# Patient Record
Sex: Female | Born: 1950 | Race: White | Hispanic: No | Marital: Married | Smoking: Never smoker
Health system: Southern US, Community
[De-identification: ages and names within clinical notes are randomized; demographics above are authoritative.]

## PROBLEM LIST (undated history)

## (undated) DIAGNOSIS — C4490 Unspecified malignant neoplasm of skin, unspecified: Secondary | ICD-10-CM

## (undated) DIAGNOSIS — E039 Hypothyroidism, unspecified: Secondary | ICD-10-CM

## (undated) DIAGNOSIS — E079 Disorder of thyroid, unspecified: Secondary | ICD-10-CM

## (undated) DIAGNOSIS — I10 Essential (primary) hypertension: Secondary | ICD-10-CM

## (undated) HISTORY — PX: COLON SURGERY: SHX602

## (undated) HISTORY — DX: Unspecified malignant neoplasm of skin, unspecified: C44.90

## (undated) HISTORY — PX: EYE SURGERY: SHX253

## (undated) HISTORY — PX: COLONOSCOPY: SHX174

---

## 2007-02-01 ENCOUNTER — Ambulatory Visit: Payer: Self-pay | Admitting: Gastroenterology

## 2010-03-31 ENCOUNTER — Ambulatory Visit: Payer: Self-pay

## 2010-09-06 DEATH — deceased

## 2011-06-15 ENCOUNTER — Ambulatory Visit: Payer: Self-pay

## 2012-07-05 ENCOUNTER — Ambulatory Visit: Payer: Self-pay

## 2013-10-06 ENCOUNTER — Ambulatory Visit: Payer: Self-pay

## 2015-03-08 ENCOUNTER — Other Ambulatory Visit: Payer: Self-pay | Admitting: Physician Assistant

## 2015-03-08 DIAGNOSIS — Z1231 Encounter for screening mammogram for malignant neoplasm of breast: Secondary | ICD-10-CM

## 2015-03-13 ENCOUNTER — Ambulatory Visit
Admission: RE | Admit: 2015-03-13 | Discharge: 2015-03-13 | Disposition: A | Discharge status: Home / Self Care | Payer: BLUE CROSS/BLUE SHIELD | Source: Ambulatory Visit | Attending: Physician Assistant | Admitting: Physician Assistant

## 2015-03-13 DIAGNOSIS — Z1231 Encounter for screening mammogram for malignant neoplasm of breast: Secondary | ICD-10-CM

## 2015-03-24 ENCOUNTER — Encounter: Payer: Self-pay | Admitting: *Deleted

## 2015-03-24 ENCOUNTER — Emergency Department
Admission: EM | Admit: 2015-03-24 | Discharge: 2015-03-24 | Disposition: A | Discharge status: Home / Self Care | Payer: BLUE CROSS/BLUE SHIELD | Attending: Emergency Medicine | Admitting: Emergency Medicine

## 2015-03-24 DIAGNOSIS — R519 Headache, unspecified: Secondary | ICD-10-CM

## 2015-03-24 DIAGNOSIS — I10 Essential (primary) hypertension: Secondary | ICD-10-CM | POA: Diagnosis not present

## 2015-03-24 DIAGNOSIS — E079 Disorder of thyroid, unspecified: Secondary | ICD-10-CM | POA: Diagnosis not present

## 2015-03-24 DIAGNOSIS — R51 Headache: Secondary | ICD-10-CM | POA: Insufficient documentation

## 2015-03-24 HISTORY — DX: Essential (primary) hypertension: I10

## 2015-03-24 HISTORY — DX: Disorder of thyroid, unspecified: E07.9

## 2015-03-24 NOTE — ED Provider Notes (Signed)
[facility] Emergency Department Provider Note  ____________________________________________  Time seen: Approximately 9:30 PM  I have reviewed the triage vital signs and the nursing notes.   HISTORY  Chief Complaint Hypertension and Headache    HPI Alexandria Craig is a 65 y.o. female with a history of hypertension and recent diagnosis of the flu is presenting tonight with a headache and hypertension. She says that the headache started about 6 PM tonight and was a 5 out of 10. It was frontal. Pressure-like and is now completely resolved. She became concerned when she took her blood pressure and it was up in the high 100s over low 100s. She says that she has been compliant with her blood pressure medications. Has been drinking plenty of water as to not Dehydrated with her flu symptoms. She denies any shortness of breath or chest pain. Denies any cardiac disease.   Past Medical History  Diagnosis Date  . Hypertension   . Thyroid disease     There are no active problems to display for this patient.   Past Surgical History  Procedure Laterality Date  . Cesarean section      Current Outpatient Rx  Name  Route  Sig  Dispense  Refill  . acetaminophen (TYLENOL) 325 MG tablet   Oral   Take 650 mg by mouth every 6 (six) hours as needed.         Marland Kitchen aspirin EC 81 MG tablet   Oral   Take 81 mg by mouth daily.         . benazepril (LOTENSIN) 20 MG tablet   Oral   Take 20 mg by mouth daily.         Marland Kitchen Fexofenadine HCl (MUCINEX ALLERGY PO)   Oral   Take 1 tablet by mouth daily as needed.         Marland Kitchen guaiFENesin-codeine (ROBITUSSIN AC) 100-10 MG/5ML syrup   Oral   Take 10 mLs by mouth every 4 (four) hours as needed for cough.         . levothyroxine (SYNTHROID, LEVOTHROID) 75 MCG tablet   Oral   Take 75 mcg by mouth daily before breakfast.           Allergies Review of patient's allergies indicates no known allergies.  Family History   Problem Relation Age of Onset  . Breast cancer Mother 51    Social History Social History  Substance Use Topics  . Smoking status: Never Smoker   . Smokeless tobacco: Never Used  . Alcohol Use: No    Review of Systems Constitutional: No fever/chills Eyes: No visual changes. ENT: No sore throat. Cardiovascular: Denies chest pain. Respiratory: Cough which she says is improving with her flu treatment. Gastrointestinal: No abdominal pain.  No nausea, no vomiting.  No diarrhea.  No constipation. Genitourinary: Negative for dysuria. Musculoskeletal: Negative for back pain. Skin: Negative for rash. Neurological: Negative for  focal weakness or numbness.  10-point ROS otherwise negative.  ____________________________________________   PHYSICAL EXAM:  VITAL SIGNS: ED Triage Vitals  Enc Vitals Group     BP 03/24/15 2009 180/83 mmHg     Pulse Rate 03/24/15 2009 103     Resp 03/24/15 2009 22     Temp 03/24/15 2009 98.3 F (36.8 C)     Temp Source 03/24/15 2009 Oral     SpO2 03/24/15 2009 97 %     Weight 03/24/15 2009 147 lb (66.679 kg)     Height 03/24/15  2009 5\' 2"  (1.575 m)     Head Cir --      Peak Flow --      Pain Score 03/24/15 2010 0     Pain Loc --      Pain Edu? --      Excl. in GC? --     Constitutional: Alert and oriented. Well appearing and in no acute distress. Eyes: Conjunctivae are normal. PERRL. EOMI. Head: Atraumatic. Nose: No congestion/rhinnorhea. Mouth/Throat: Mucous membranes are moist.  Oropharynx non-erythematous. Neck: No stridor.   Cardiovascular: Normal rate, regular rhythm. Grossly normal heart sounds.  Good peripheral circulation. Asked to be slowly and her heart rate goes down to 96 bpm. Respiratory rate of 16 when she is relaxed. She does go into several coughing fits at which point her heart rate increased to the 120s but with slow breathing decreased back down to the 90s. Respiratory: Normal respiratory effort.  No retractions. Lungs  CTAB. Expiratory cough. Gastrointestinal: Soft and nontender. No distention. No CVA tenderness. Musculoskeletal: No lower extremity tenderness nor edema.  No joint effusions. Neurologic:  Normal speech and language. No gross focal neurologic deficits are appreciated. No gait instability. Skin:  Skin is warm, dry and intact. No rash noted. Psychiatric: Mood and affect are normal. Speech and behavior are normal.  ____________________________________________   LABS (all labs ordered are listed, but only abnormal results are displayed)  Labs Reviewed - No data to display ____________________________________________  EKG  ED ECG REPORT I, Porchia Sinkler,  Teena Iraniavid M, the attending physician, personally viewed and interpreted this ECG.   Date: 03/24/2015  EKG Time: 2025  Rate: 100  Rhythm: sinus tachycardia  Axis: Normal  Intervals:none  ST&T Change: No ST segment elevation or depression. No abnormal T-wave inversion.  ____________________________________________  RADIOLOGY   ____________________________________________   PROCEDURES   ____________________________________________   INITIAL IMPRESSION / ASSESSMENT AND PLAN / ED COURSE  Pertinent labs & imaging results that were available during my care of the patient were reviewed by me and considered in my medical decision making (see chart for details).  Patient without sudden onset, thunderclap worst headache of her life. No neck pain. No focal weakness. No chest pain or shortness of breath. Headache is resolved at this point spontaneously. Blood pressure is elevated but this is in the context of her flu and multiple coughing fits. It has decreased down to the 140s and 150s systolic in the emergency department. However, when I'm in the room and she has had several coughing fits and increases back up to the 170s and 180s. I do not see any clinical signs or symptoms at this time that the patient may be having an intracranial  hemorrhage. Possible headache from her flulike illness I feel is more likely than from the elevated blood pressure. Will be discharged home. I recommended that she continue with her blood pressure medication and make sure to drink plenty of fluids. She will also be following up with her primary care doctor later in the week in order to have a blood pressure recheck. The patient as well as her husband, who is at the bedside, understand the plan and are willing to comply. ____________________________________________   FINAL CLINICAL IMPRESSION(S) / ED DIAGNOSES  Hypertension. Headache.    Myrna Blazeravid Matthew Orvis Stann, MD 03/24/15 2158

## 2015-03-24 NOTE — ED Notes (Signed)
Pt states she started having a headache tonight. Pt states she checked her BP and it was elevated. Pt recently dx'd w/ flu x 5 days ago. Pt in no acute distress at this time. Pt states she now has a mild headache. Pt is tachycardiac and hypertensive during triage. Pt has a moist, non-productive cough. Pt denies chest pain and shortness of breath and was ambulatory from waiting room to back w/o distress.

## 2015-04-01 ENCOUNTER — Ambulatory Visit
Admission: RE | Admit: 2015-04-01 | Discharge: 2015-04-01 | Disposition: A | Discharge status: Home / Self Care | Payer: BLUE CROSS/BLUE SHIELD | Source: Ambulatory Visit | Attending: Physician Assistant | Admitting: Physician Assistant

## 2015-04-01 ENCOUNTER — Other Ambulatory Visit: Payer: Self-pay | Admitting: Physician Assistant

## 2015-04-01 DIAGNOSIS — R05 Cough: Secondary | ICD-10-CM | POA: Insufficient documentation

## 2015-04-01 DIAGNOSIS — R0602 Shortness of breath: Secondary | ICD-10-CM | POA: Insufficient documentation

## 2015-04-01 DIAGNOSIS — J189 Pneumonia, unspecified organism: Secondary | ICD-10-CM

## 2016-01-31 ENCOUNTER — Other Ambulatory Visit: Payer: Self-pay | Admitting: Physician Assistant

## 2016-02-06 ENCOUNTER — Other Ambulatory Visit: Payer: Self-pay | Admitting: Physician Assistant

## 2016-02-06 DIAGNOSIS — Z1231 Encounter for screening mammogram for malignant neoplasm of breast: Secondary | ICD-10-CM

## 2016-03-17 ENCOUNTER — Ambulatory Visit: Payer: BLUE CROSS/BLUE SHIELD

## 2016-03-19 ENCOUNTER — Ambulatory Visit
Admission: RE | Admit: 2016-03-19 | Discharge: 2016-03-19 | Disposition: A | Discharge status: Home / Self Care | Payer: BLUE CROSS/BLUE SHIELD | Source: Ambulatory Visit | Attending: Physician Assistant | Admitting: Physician Assistant

## 2016-03-19 DIAGNOSIS — Z1231 Encounter for screening mammogram for malignant neoplasm of breast: Secondary | ICD-10-CM | POA: Diagnosis not present

## 2017-04-05 ENCOUNTER — Other Ambulatory Visit: Payer: Self-pay | Admitting: Physician Assistant

## 2017-04-05 DIAGNOSIS — Z1231 Encounter for screening mammogram for malignant neoplasm of breast: Secondary | ICD-10-CM

## 2017-04-22 ENCOUNTER — Ambulatory Visit
Admission: RE | Admit: 2017-04-22 | Discharge: 2017-04-22 | Disposition: A | Discharge status: Home / Self Care | Payer: Medicare Other | Source: Ambulatory Visit | Attending: Physician Assistant | Admitting: Physician Assistant

## 2017-04-22 DIAGNOSIS — R928 Other abnormal and inconclusive findings on diagnostic imaging of breast: Secondary | ICD-10-CM | POA: Diagnosis not present

## 2017-04-22 DIAGNOSIS — Z1231 Encounter for screening mammogram for malignant neoplasm of breast: Secondary | ICD-10-CM | POA: Diagnosis not present

## 2017-04-22 DIAGNOSIS — N6310 Unspecified lump in the right breast, unspecified quadrant: Secondary | ICD-10-CM | POA: Insufficient documentation

## 2017-04-27 ENCOUNTER — Other Ambulatory Visit: Payer: Self-pay | Admitting: Physician Assistant

## 2017-04-27 DIAGNOSIS — N6310 Unspecified lump in the right breast, unspecified quadrant: Secondary | ICD-10-CM

## 2017-04-27 DIAGNOSIS — R928 Other abnormal and inconclusive findings on diagnostic imaging of breast: Secondary | ICD-10-CM

## 2017-05-13 ENCOUNTER — Ambulatory Visit
Admission: RE | Admit: 2017-05-13 | Discharge: 2017-05-13 | Disposition: A | Discharge status: Home / Self Care | Payer: Medicare Other | Source: Ambulatory Visit | Attending: Physician Assistant | Admitting: Physician Assistant

## 2017-05-13 DIAGNOSIS — N6310 Unspecified lump in the right breast, unspecified quadrant: Secondary | ICD-10-CM

## 2017-05-13 DIAGNOSIS — R928 Other abnormal and inconclusive findings on diagnostic imaging of breast: Secondary | ICD-10-CM

## 2017-12-27 ENCOUNTER — Other Ambulatory Visit: Payer: Self-pay | Admitting: Physician Assistant

## 2017-12-27 DIAGNOSIS — Z13820 Encounter for screening for osteoporosis: Secondary | ICD-10-CM

## 2018-02-09 ENCOUNTER — Ambulatory Visit
Admission: RE | Admit: 2018-02-09 | Discharge: 2018-02-09 | Disposition: A | Discharge status: Home / Self Care | Payer: Medicare Other | Source: Ambulatory Visit | Attending: Physician Assistant | Admitting: Physician Assistant

## 2018-02-09 DIAGNOSIS — M8589 Other specified disorders of bone density and structure, multiple sites: Secondary | ICD-10-CM | POA: Diagnosis not present

## 2018-02-09 DIAGNOSIS — Z13820 Encounter for screening for osteoporosis: Secondary | ICD-10-CM | POA: Insufficient documentation

## 2018-11-23 ENCOUNTER — Other Ambulatory Visit: Payer: Self-pay | Admitting: Physician Assistant

## 2018-11-23 DIAGNOSIS — Z1231 Encounter for screening mammogram for malignant neoplasm of breast: Secondary | ICD-10-CM

## 2019-06-08 ENCOUNTER — Encounter: Payer: Self-pay | Admitting: Ophthalmology

## 2019-06-08 ENCOUNTER — Other Ambulatory Visit: Payer: Self-pay

## 2019-06-12 ENCOUNTER — Other Ambulatory Visit
Admission: RE | Admit: 2019-06-12 | Discharge: 2019-06-12 | Disposition: A | Discharge status: Home / Self Care | Payer: PRIVATE HEALTH INSURANCE | Source: Ambulatory Visit | Attending: Ophthalmology | Admitting: Ophthalmology

## 2019-06-12 ENCOUNTER — Other Ambulatory Visit: Payer: Self-pay

## 2019-06-12 DIAGNOSIS — Z01812 Encounter for preprocedural laboratory examination: Secondary | ICD-10-CM | POA: Insufficient documentation

## 2019-06-12 DIAGNOSIS — Z20822 Contact with and (suspected) exposure to covid-19: Secondary | ICD-10-CM | POA: Diagnosis not present

## 2019-06-12 LAB — SARS CORONAVIRUS 2 (TAT 6-24 HRS): SARS Coronavirus 2: NEGATIVE

## 2019-06-12 NOTE — Discharge Instructions (Signed)

## 2019-06-14 ENCOUNTER — Ambulatory Visit
Admission: RE | Admit: 2019-06-14 | Discharge: 2019-06-14 | Disposition: A | Discharge status: Home / Self Care | Payer: Medicare Other | Attending: Ophthalmology | Admitting: Ophthalmology

## 2019-06-14 ENCOUNTER — Ambulatory Visit: Payer: Medicare Other | Admitting: Anesthesiology

## 2019-06-14 ENCOUNTER — Encounter: Payer: Self-pay | Admitting: Ophthalmology

## 2019-06-14 ENCOUNTER — Encounter
Admission: RE | Disposition: A | Discharge status: Home / Self Care | Payer: Self-pay | Source: Home / Self Care | Attending: Ophthalmology

## 2019-06-14 ENCOUNTER — Other Ambulatory Visit: Payer: Self-pay

## 2019-06-14 DIAGNOSIS — Z79890 Hormone replacement therapy (postmenopausal): Secondary | ICD-10-CM | POA: Insufficient documentation

## 2019-06-14 DIAGNOSIS — Z7982 Long term (current) use of aspirin: Secondary | ICD-10-CM | POA: Diagnosis not present

## 2019-06-14 DIAGNOSIS — Z79899 Other long term (current) drug therapy: Secondary | ICD-10-CM | POA: Diagnosis not present

## 2019-06-14 DIAGNOSIS — E039 Hypothyroidism, unspecified: Secondary | ICD-10-CM | POA: Diagnosis not present

## 2019-06-14 DIAGNOSIS — H2511 Age-related nuclear cataract, right eye: Secondary | ICD-10-CM | POA: Insufficient documentation

## 2019-06-14 DIAGNOSIS — I10 Essential (primary) hypertension: Secondary | ICD-10-CM | POA: Diagnosis not present

## 2019-06-14 HISTORY — DX: Hypothyroidism, unspecified: E03.9

## 2019-06-14 HISTORY — PX: CATARACT EXTRACTION W/PHACO: SHX586

## 2019-06-14 SURGERY — PHACOEMULSIFICATION, CATARACT, WITH IOL INSERTION
Anesthesia: Monitor Anesthesia Care | Site: Eye | Laterality: Right

## 2019-06-14 MED ORDER — FENTANYL CITRATE (PF) 100 MCG/2ML IJ SOLN
INTRAMUSCULAR | Status: DC | PRN
  Administered 2019-06-14 (×2): 50 ug via INTRAVENOUS

## 2019-06-14 MED ORDER — MIDAZOLAM HCL 2 MG/2ML IJ SOLN
INTRAMUSCULAR | Status: DC | PRN
  Administered 2019-06-14: 2 mg via INTRAVENOUS

## 2019-06-14 MED ORDER — LIDOCAINE HCL (PF) 2 % IJ SOLN
INTRAOCULAR | Status: DC | PRN
  Administered 2019-06-14: 1 mL

## 2019-06-14 MED ORDER — MOXIFLOXACIN HCL 0.5 % OP SOLN
1.0000 [drp] | OPHTHALMIC | Status: DC | PRN
  Administered 2019-06-14 (×3): 1 [drp] via OPHTHALMIC

## 2019-06-14 MED ORDER — CEFUROXIME OPHTHALMIC INJECTION 1 MG/0.1 ML
INJECTION | OPHTHALMIC | Status: DC | PRN
  Administered 2019-06-14: 0.1 mL via INTRACAMERAL

## 2019-06-14 MED ORDER — ARMC OPHTHALMIC DILATING DROPS
1.0000 "application " | OPHTHALMIC | Status: DC | PRN
  Administered 2019-06-14 (×3): 1 via OPHTHALMIC

## 2019-06-14 MED ORDER — EPINEPHRINE PF 1 MG/ML IJ SOLN
INTRAOCULAR | Status: DC | PRN
  Administered 2019-06-14: 55 mL via OPHTHALMIC

## 2019-06-14 MED ORDER — NA HYALUR & NA CHOND-NA HYALUR 0.4-0.35 ML IO KIT
PACK | INTRAOCULAR | Status: DC | PRN
  Administered 2019-06-14: 1 mL via INTRAOCULAR

## 2019-06-14 MED ORDER — TETRACAINE HCL 0.5 % OP SOLN
1.0000 [drp] | OPHTHALMIC | Status: DC | PRN
  Administered 2019-06-14 (×3): 1 [drp] via OPHTHALMIC

## 2019-06-14 MED ORDER — BRIMONIDINE TARTRATE-TIMOLOL 0.2-0.5 % OP SOLN
OPHTHALMIC | Status: DC | PRN
  Administered 2019-06-14: 1 [drp] via OPHTHALMIC

## 2019-06-14 SURGICAL SUPPLY — 29 items
CANNULA ANT/CHMB 27G (MISCELLANEOUS) ×1 IMPLANT
CANNULA ANT/CHMB 27GA (MISCELLANEOUS) ×3 IMPLANT
GLOVE SURG LX 7.5 STRW (GLOVE) ×4
GLOVE SURG LX STRL 7.5 STRW (GLOVE) ×1 IMPLANT
GLOVE SURG TRIUMPH 8.0 PF LTX (GLOVE) ×3 IMPLANT
GOWN STRL REUS W/ TWL LRG LVL3 (GOWN DISPOSABLE) ×2 IMPLANT
GOWN STRL REUS W/TWL LRG LVL3 (GOWN DISPOSABLE) ×4
LENS IOL DIOP 23.0 (Intraocular Lens) ×3 IMPLANT
LENS IOL TECNIS MONO 23.0 (Intraocular Lens) IMPLANT
MARKER SKIN DUAL TIP RULER LAB (MISCELLANEOUS) ×3 IMPLANT
NDL CAPSULORHEX 25GA (NEEDLE) ×1 IMPLANT
NDL FILTER BLUNT 18X1 1/2 (NEEDLE) ×2 IMPLANT
NDL RETROBULBAR .5 NSTRL (NEEDLE) IMPLANT
NEEDLE CAPSULORHEX 25GA (NEEDLE) ×3 IMPLANT
NEEDLE FILTER BLUNT 18X 1/2SAF (NEEDLE) ×4
NEEDLE FILTER BLUNT 18X1 1/2 (NEEDLE) ×2 IMPLANT
PACK CATARACT BRASINGTON (MISCELLANEOUS) ×3 IMPLANT
PACK EYE AFTER SURG (MISCELLANEOUS) ×3 IMPLANT
PACK OPTHALMIC (MISCELLANEOUS) ×3 IMPLANT
RING MALYGIN 7.0 (MISCELLANEOUS) IMPLANT
SOLUTION OPHTHALMIC SALT (MISCELLANEOUS) ×3 IMPLANT
SUT ETHILON 10-0 CS-B-6CS-B-6 (SUTURE)
SUT VICRYL  9 0 (SUTURE)
SUT VICRYL 9 0 (SUTURE) IMPLANT
SUTURE EHLN 10-0 CS-B-6CS-B-6 (SUTURE) IMPLANT
SYR 3ML LL SCALE MARK (SYRINGE) ×6 IMPLANT
SYR TB 1ML LUER SLIP (SYRINGE) ×3 IMPLANT
WATER STERILE IRR 250ML POUR (IV SOLUTION) ×3 IMPLANT
WIPE NON LINTING 3.25X3.25 (MISCELLANEOUS) ×3 IMPLANT

## 2019-06-14 NOTE — Op Note (Signed)
LOCATION:  [facility]   PREOPERATIVE DIAGNOSIS:    Nuclear sclerotic cataract right eye. H25.11   POSTOPERATIVE DIAGNOSIS:  Nuclear sclerotic cataract right eye.     PROCEDURE:  Phacoemusification with posterior chamber intraocular lens placement of the right eye   ULTRASOUND TIME: Procedure(s) with comments: CATARACT EXTRACTION PHACO AND INTRAOCULAR LENS PLACEMENT (IOC) RIGHT (Right) - 8.13, 0:50, 16.1%  LENS:   Implant Name Type Inv. Item Serial No. Manufacturer Lot No. LRB No. Used Action  LENS IOL DIOP 23.0 - [id number] Intraocular Lens LENS IOL DIOP 23.0 [id number] AMO  Right 1 Implanted         SURGEON:  Deirdre Evener, MD   ANESTHESIA:  Topical with tetracaine drops and 2% Xylocaine jelly, augmented with 1% preservative-free intracameral lidocaine.    COMPLICATIONS:  None.   DESCRIPTION OF PROCEDURE:  The patient was identified in the holding room and transported to the operating room and placed in the supine position under the operating microscope.  The right eye was identified as the operative eye and it was prepped and draped in the usual sterile ophthalmic fashion.   A 1 millimeter clear-corneal paracentesis was made at the 12:00 position.  0.5 ml of preservative-free 1% lidocaine was injected into the anterior chamber. The anterior chamber was filled with Viscoat viscoelastic.  A 2.4 millimeter keratome was used to make a near-clear corneal incision at the 9:00 position.  A curvilinear capsulorrhexis was made with a cystotome and capsulorrhexis forceps.  Balanced salt solution was used to hydrodissect and hydrodelineate the nucleus.   Phacoemulsification was then used in stop and chop fashion to remove the lens nucleus and epinucleus.  The remaining cortex was then removed using the irrigation and aspiration handpiece. Provisc was then placed into the capsular bag to distend it for lens placement.  A lens was then injected into the capsular bag.  The  remaining viscoelastic was aspirated.   Wounds were hydrated with balanced salt solution.  The anterior chamber was inflated to a physiologic pressure with balanced salt solution.  No wound leaks were noted. Cefuroxime 0.1 ml of a 10mg /ml solution was injected into the anterior chamber for a dose of 1 mg of intracameral antibiotic at the completion of the case.   Timolol and Brimonidine drops were applied to the eye.  The patient was taken to the recovery room in stable condition without complications of anesthesia or surgery.   Geralynn Capri 06/14/2019, 9:38 AM

## 2019-06-14 NOTE — Anesthesia Procedure Notes (Signed)
Procedure Name: MAC Date/Time: 06/14/2019 9:20 AM Performed by: Jeannene Patella, CRNA Pre-anesthesia Checklist: Patient identified, Emergency Drugs available, Suction available, Patient being monitored and Timeout performed Patient Re-evaluated:Patient Re-evaluated prior to induction Oxygen Delivery Method: Nasal cannula

## 2019-06-14 NOTE — H&P (Signed)

## 2019-06-14 NOTE — Transfer of Care (Signed)
Immediate Anesthesia Transfer of Care Note  Patient: Alexandria Craig  Procedure(s) Performed: CATARACT EXTRACTION PHACO AND INTRAOCULAR LENS PLACEMENT (IOC) RIGHT (Right Eye)  Patient Location: PACU  Anesthesia Type: MAC  Level of Consciousness: awake, alert  and patient cooperative  Airway and Oxygen Therapy: Patient Spontanous Breathing and Patient connected to supplemental oxygen  Post-op Assessment: Post-op Vital signs reviewed, Patient's Cardiovascular Status Stable, Respiratory Function Stable, Patent Airway and No signs of Nausea or vomiting  Post-op Vital Signs: Reviewed and stable  Complications: No apparent anesthesia complications

## 2019-06-14 NOTE — Anesthesia Preprocedure Evaluation (Signed)
Anesthesia Evaluation  Patient identified by MRN, date of birth, ID band Patient awake    History of Anesthesia Complications Negative for: history of anesthetic complications  Airway Mallampati: II  TM Distance: >3 FB Neck ROM: Full    Dental no notable dental hx.    Pulmonary neg pulmonary ROS,    Pulmonary exam normal        Cardiovascular Exercise Tolerance: Good hypertension, Pt. on medications Normal cardiovascular exam     Neuro/Psych negative neurological ROS     GI/Hepatic negative GI ROS, Neg liver ROS,   Endo/Other  Hypothyroidism   Renal/GU negative Renal ROS     Musculoskeletal negative musculoskeletal ROS (+)   Abdominal   Peds  Hematology negative hematology ROS (+)   Anesthesia Other Findings   Reproductive/Obstetrics                            Anesthesia Physical Anesthesia Plan  ASA: II  Anesthesia Plan: MAC   Post-op Pain Management:    Induction: Intravenous  PONV Risk Score and Plan: 2 and Midazolam, TIVA and Treatment may vary due to age or medical condition  Airway Management Planned: Nasal Cannula and Natural Airway  Additional Equipment: None  Intra-op Plan:   Post-operative Plan:   Informed Consent: I have reviewed the patients History and Physical, chart, labs and discussed the procedure including the risks, benefits and alternatives for the proposed anesthesia with the patient or authorized representative who has indicated his/her understanding and acceptance.       Plan Discussed with: CRNA  Anesthesia Plan Comments:         Anesthesia Quick Evaluation

## 2019-06-14 NOTE — Anesthesia Postprocedure Evaluation (Signed)
Anesthesia Post Note  Patient: Alexandria Craig  Procedure(s) Performed: CATARACT EXTRACTION PHACO AND INTRAOCULAR LENS PLACEMENT (IOC) RIGHT (Right Eye)     Patient location during evaluation: PACU Anesthesia Type: MAC Level of consciousness: awake and alert Pain management: pain level controlled Vital Signs Assessment: post-procedure vital signs reviewed and stable Respiratory status: spontaneous breathing Cardiovascular status: blood pressure returned to baseline Postop Assessment: no apparent nausea or vomiting, adequate PO intake and no headache Anesthetic complications: no    Adele Barthel Saja Bartolini

## 2019-06-15 ENCOUNTER — Encounter: Payer: Self-pay | Admitting: Ophthalmology

## 2019-07-04 ENCOUNTER — Other Ambulatory Visit: Payer: Self-pay

## 2019-07-04 ENCOUNTER — Encounter: Payer: Self-pay | Admitting: Ophthalmology

## 2019-07-06 NOTE — Discharge Instructions (Signed)

## 2019-07-11 NOTE — Anesthesia Preprocedure Evaluation (Addendum)
Anesthesia Evaluation  Patient identified by MRN, date of birth, ID band Patient awake    Reviewed: Allergy & Precautions, NPO status , Patient's Chart, lab work & pertinent test results, reviewed documented beta blocker date and time   History of Anesthesia Complications Negative for: history of anesthetic complications  Airway Mallampati: II  TM Distance: >3 FB Neck ROM: Full    Dental no notable dental hx.    Pulmonary    Pulmonary exam normal breath sounds clear to auscultation       Cardiovascular Exercise Tolerance: Good hypertension, Pt. on medications (-) angina(-) DOE Normal cardiovascular exam Rhythm:Regular Rate:Normal     Neuro/Psych    GI/Hepatic neg GERD  ,  Endo/Other  Hypothyroidism   Renal/GU      Musculoskeletal   Abdominal   Peds  Hematology   Anesthesia Other Findings   Reproductive/Obstetrics                            Anesthesia Physical  Anesthesia Plan  ASA: II  Anesthesia Plan: MAC   Post-op Pain Management:    Induction: Intravenous  PONV Risk Score and Plan: 2 and Midazolam, TIVA and Treatment may vary due to age or medical condition  Airway Management Planned: Nasal Cannula and Natural Airway  Additional Equipment: None  Intra-op Plan:   Post-operative Plan:   Informed Consent: I have reviewed the patients History and Physical, chart, labs and discussed the procedure including the risks, benefits and alternatives for the proposed anesthesia with the patient or authorized representative who has indicated his/her understanding and acceptance.       Plan Discussed with: CRNA  Anesthesia Plan Comments:         Anesthesia Quick Evaluation

## 2019-07-12 ENCOUNTER — Ambulatory Visit: Payer: Medicare Other | Admitting: Anesthesiology

## 2019-07-12 ENCOUNTER — Other Ambulatory Visit: Payer: Self-pay

## 2019-07-12 ENCOUNTER — Encounter
Admission: RE | Disposition: A | Discharge status: Home / Self Care | Payer: Self-pay | Source: Home / Self Care | Attending: Ophthalmology

## 2019-07-12 ENCOUNTER — Ambulatory Visit
Admission: RE | Admit: 2019-07-12 | Discharge: 2019-07-12 | Disposition: A | Discharge status: Home / Self Care | Payer: Medicare Other | Attending: Ophthalmology | Admitting: Ophthalmology

## 2019-07-12 ENCOUNTER — Encounter: Payer: Self-pay | Admitting: Ophthalmology

## 2019-07-12 DIAGNOSIS — Z7982 Long term (current) use of aspirin: Secondary | ICD-10-CM | POA: Insufficient documentation

## 2019-07-12 DIAGNOSIS — I10 Essential (primary) hypertension: Secondary | ICD-10-CM | POA: Insufficient documentation

## 2019-07-12 DIAGNOSIS — H2512 Age-related nuclear cataract, left eye: Secondary | ICD-10-CM | POA: Diagnosis present

## 2019-07-12 DIAGNOSIS — E039 Hypothyroidism, unspecified: Secondary | ICD-10-CM | POA: Diagnosis not present

## 2019-07-12 DIAGNOSIS — E7800 Pure hypercholesterolemia, unspecified: Secondary | ICD-10-CM | POA: Diagnosis not present

## 2019-07-12 DIAGNOSIS — Z9049 Acquired absence of other specified parts of digestive tract: Secondary | ICD-10-CM | POA: Insufficient documentation

## 2019-07-12 DIAGNOSIS — Z9841 Cataract extraction status, right eye: Secondary | ICD-10-CM | POA: Diagnosis not present

## 2019-07-12 DIAGNOSIS — Z79890 Hormone replacement therapy (postmenopausal): Secondary | ICD-10-CM | POA: Diagnosis not present

## 2019-07-12 DIAGNOSIS — Z79899 Other long term (current) drug therapy: Secondary | ICD-10-CM | POA: Diagnosis not present

## 2019-07-12 DIAGNOSIS — F329 Major depressive disorder, single episode, unspecified: Secondary | ICD-10-CM | POA: Diagnosis not present

## 2019-07-12 HISTORY — PX: CATARACT EXTRACTION W/PHACO: SHX586

## 2019-07-12 SURGERY — PHACOEMULSIFICATION, CATARACT, WITH IOL INSERTION
Anesthesia: Monitor Anesthesia Care | Site: Eye | Laterality: Left

## 2019-07-12 MED ORDER — ACETAMINOPHEN 10 MG/ML IV SOLN: 1000.0000 mg | Freq: Once | INTRAVENOUS | Status: DC | PRN

## 2019-07-12 MED ORDER — CEFUROXIME OPHTHALMIC INJECTION 1 MG/0.1 ML
INJECTION | OPHTHALMIC | Status: DC | PRN
  Administered 2019-07-12: 0.1 mL via INTRACAMERAL

## 2019-07-12 MED ORDER — EPINEPHRINE PF 1 MG/ML IJ SOLN
INTRAOCULAR | Status: DC | PRN
  Administered 2019-07-12: 56 mL via OPHTHALMIC

## 2019-07-12 MED ORDER — LIDOCAINE HCL (PF) 2 % IJ SOLN
INTRAOCULAR | Status: DC | PRN
  Administered 2019-07-12: 2 mL

## 2019-07-12 MED ORDER — FENTANYL CITRATE (PF) 100 MCG/2ML IJ SOLN
INTRAMUSCULAR | Status: DC | PRN
  Administered 2019-07-12 (×2): 50 ug via INTRAVENOUS

## 2019-07-12 MED ORDER — LACTATED RINGERS IV SOLN: INTRAVENOUS | Status: DC

## 2019-07-12 MED ORDER — NA HYALUR & NA CHOND-NA HYALUR 0.4-0.35 ML IO KIT
PACK | INTRAOCULAR | Status: DC | PRN
  Administered 2019-07-12: 1 mL via INTRAOCULAR

## 2019-07-12 MED ORDER — MIDAZOLAM HCL 2 MG/2ML IJ SOLN
INTRAMUSCULAR | Status: DC | PRN
  Administered 2019-07-12 (×2): 1 mg via INTRAVENOUS

## 2019-07-12 MED ORDER — MOXIFLOXACIN HCL 0.5 % OP SOLN
1.0000 [drp] | OPHTHALMIC | Status: DC | PRN
  Administered 2019-07-12 (×3): 1 [drp] via OPHTHALMIC

## 2019-07-12 MED ORDER — TETRACAINE HCL 0.5 % OP SOLN
1.0000 [drp] | OPHTHALMIC | Status: DC | PRN
  Administered 2019-07-12 (×3): 1 [drp] via OPHTHALMIC

## 2019-07-12 MED ORDER — ARMC OPHTHALMIC DILATING DROPS
1.0000 "application " | OPHTHALMIC | Status: DC | PRN
  Administered 2019-07-12 (×3): 1 via OPHTHALMIC

## 2019-07-12 MED ORDER — BRIMONIDINE TARTRATE-TIMOLOL 0.2-0.5 % OP SOLN
OPHTHALMIC | Status: DC | PRN
  Administered 2019-07-12: 1 [drp] via OPHTHALMIC

## 2019-07-12 MED ORDER — ONDANSETRON HCL 4 MG/2ML IJ SOLN: 4.0000 mg | Freq: Once | INTRAMUSCULAR | Status: DC | PRN

## 2019-07-12 SURGICAL SUPPLY — 29 items
CANNULA ANT/CHMB 27G (MISCELLANEOUS) ×1 IMPLANT
CANNULA ANT/CHMB 27GA (MISCELLANEOUS) ×3 IMPLANT
GLOVE SURG LX 7.5 STRW (GLOVE) ×4
GLOVE SURG LX STRL 7.5 STRW (GLOVE) ×1 IMPLANT
GLOVE SURG TRIUMPH 8.0 PF LTX (GLOVE) ×3 IMPLANT
GOWN STRL REUS W/ TWL LRG LVL3 (GOWN DISPOSABLE) ×2 IMPLANT
GOWN STRL REUS W/TWL LRG LVL3 (GOWN DISPOSABLE) ×4
LENS IOL DIOP 23.0 (Intraocular Lens) ×3 IMPLANT
LENS IOL TECNIS MONO 23.0 (Intraocular Lens) IMPLANT
MARKER SKIN DUAL TIP RULER LAB (MISCELLANEOUS) ×3 IMPLANT
NDL CAPSULORHEX 25GA (NEEDLE) ×1 IMPLANT
NDL FILTER BLUNT 18X1 1/2 (NEEDLE) ×2 IMPLANT
NDL RETROBULBAR .5 NSTRL (NEEDLE) IMPLANT
NEEDLE CAPSULORHEX 25GA (NEEDLE) ×3 IMPLANT
NEEDLE FILTER BLUNT 18X 1/2SAF (NEEDLE) ×4
NEEDLE FILTER BLUNT 18X1 1/2 (NEEDLE) ×2 IMPLANT
PACK CATARACT BRASINGTON (MISCELLANEOUS) ×3 IMPLANT
PACK EYE AFTER SURG (MISCELLANEOUS) ×3 IMPLANT
PACK OPTHALMIC (MISCELLANEOUS) ×3 IMPLANT
RING MALYGIN 7.0 (MISCELLANEOUS) IMPLANT
SOLUTION OPHTHALMIC SALT (MISCELLANEOUS) ×3 IMPLANT
SUT ETHILON 10-0 CS-B-6CS-B-6 (SUTURE)
SUT VICRYL  9 0 (SUTURE)
SUT VICRYL 9 0 (SUTURE) IMPLANT
SUTURE EHLN 10-0 CS-B-6CS-B-6 (SUTURE) IMPLANT
SYR 3ML LL SCALE MARK (SYRINGE) ×6 IMPLANT
SYR TB 1ML LUER SLIP (SYRINGE) ×3 IMPLANT
WATER STERILE IRR 250ML POUR (IV SOLUTION) ×3 IMPLANT
WIPE NON LINTING 3.25X3.25 (MISCELLANEOUS) ×3 IMPLANT

## 2019-07-12 NOTE — Transfer of Care (Signed)
Immediate Anesthesia Transfer of Care Note  Patient: Alexandria Craig  Procedure(s) Performed: CATARACT EXTRACTION PHACO AND INTRAOCULAR LENS PLACEMENT (IOC) LEFT (Left Eye)  Patient Location: PACU  Anesthesia Type: MAC  Level of Consciousness: awake, alert  and patient cooperative  Airway and Oxygen Therapy: Patient Spontanous Breathing and Patient connected to supplemental oxygen  Post-op Assessment: Post-op Vital signs reviewed, Patient's Cardiovascular Status Stable, Respiratory Function Stable, Patent Airway and No signs of Nausea or vomiting  Post-op Vital Signs: Reviewed and stable  Complications: No complications documented.

## 2019-07-12 NOTE — Anesthesia Postprocedure Evaluation (Signed)
Anesthesia Post Note  Patient: Alexandria Craig  Procedure(s) Performed: CATARACT EXTRACTION PHACO AND INTRAOCULAR LENS PLACEMENT (IOC) LEFT 5.49 00:51.8 10.6% (Left Eye)     Patient location during evaluation: PACU Anesthesia Type: MAC Level of consciousness: awake and alert Pain management: pain level controlled Vital Signs Assessment: post-procedure vital signs reviewed and stable Respiratory status: spontaneous breathing, nonlabored ventilation, respiratory function stable and patient connected to nasal cannula oxygen Cardiovascular status: stable and blood pressure returned to baseline Postop Assessment: no apparent nausea or vomiting Anesthetic complications: no   No complications documented.  Baani Bober A  Turrell Severt

## 2019-07-12 NOTE — Anesthesia Procedure Notes (Signed)
Procedure Name: MAC Date/Time: 07/12/2019 1:02 PM Performed by: Vanetta Shawl, CRNA Pre-anesthesia Checklist: Patient identified, Emergency Drugs available, Suction available, Timeout performed and Patient being monitored Patient Re-evaluated:Patient Re-evaluated prior to induction Oxygen Delivery Method: Nasal cannula Placement Confirmation: positive ETCO2

## 2019-07-12 NOTE — H&P (Signed)

## 2019-07-12 NOTE — Op Note (Signed)
OPERATIVE NOTE  Alexandria Craig [id number] 07/12/2019   PREOPERATIVE DIAGNOSIS:  Nuclear sclerotic cataract left eye. H25.12   POSTOPERATIVE DIAGNOSIS:    Nuclear sclerotic cataract left eye.     PROCEDURE:  Phacoemusification with posterior chamber intraocular lens placement of the left eye  Ultrasound time: Procedure(s): CATARACT EXTRACTION PHACO AND INTRAOCULAR LENS PLACEMENT (IOC) LEFT 5.49 00:51.8 10.6% (Left)  LENS:   Implant Name Type Inv. Item Serial No. Manufacturer Lot No. LRB No. Used Action  LENS IOL DIOP 23.0 - [id number] Intraocular Lens LENS IOL DIOP 23.0 [id number] [employer]O [employer]  Left 1 Implanted      SURGEON:  Deirdre Evener, MD   ANESTHESIA:  Topical with tetracaine drops and 2% Xylocaine jelly, augmented with 1% preservative-free intracameral lidocaine.    COMPLICATIONS:  None.   DESCRIPTION OF PROCEDURE:  The patient was identified in the holding room and transported to the operating room and placed in the supine position under the operating microscope.  The left eye was identified as the operative eye and it was prepped and draped in the usual sterile ophthalmic fashion.   A 1 millimeter clear-corneal paracentesis was made at the 1:30 position.  0.5 ml of preservative-free 1% lidocaine was injected into the anterior chamber.  The anterior chamber was filled with Viscoat viscoelastic.  A 2.4 millimeter keratome was used to make a near-clear corneal incision at the 10:30 position.  .  A curvilinear capsulorrhexis was made with a cystotome and capsulorrhexis forceps.  Balanced salt solution was used to hydrodissect and hydrodelineate the nucleus.   Phacoemulsification was then used in stop and chop fashion to remove the lens nucleus and epinucleus.  The remaining cortex was then removed using the irrigation and aspiration handpiece. Provisc was then placed into the capsular bag to distend it for lens placement.  A lens was then injected into the  capsular bag.  The remaining viscoelastic was aspirated.   Wounds were hydrated with balanced salt solution.  The anterior chamber was inflated to a physiologic pressure with balanced salt solution.  No wound leaks were noted. Cefuroxime 0.1 ml of a 10mg /ml solution was injected into the anterior chamber for a dose of 1 mg of intracameral antibiotic at the completion of the case.   Timolol and Brimonidine drops were applied to the eye.  The patient was taken to the recovery room in stable condition without complications of anesthesia or surgery.  [name] 07/12/2019, 1:22 PM

## 2019-07-13 ENCOUNTER — Encounter: Payer: Self-pay | Admitting: Ophthalmology

## 2019-08-31 ENCOUNTER — Other Ambulatory Visit: Payer: Self-pay | Admitting: Physician Assistant

## 2019-08-31 DIAGNOSIS — Z1231 Encounter for screening mammogram for malignant neoplasm of breast: Secondary | ICD-10-CM

## 2019-09-22 ENCOUNTER — Ambulatory Visit
Admission: RE | Admit: 2019-09-22 | Discharge: 2019-09-22 | Disposition: A | Discharge status: Home / Self Care | Payer: Medicare Other | Source: Ambulatory Visit | Attending: Physician Assistant | Admitting: Physician Assistant

## 2019-09-22 DIAGNOSIS — Z1231 Encounter for screening mammogram for malignant neoplasm of breast: Secondary | ICD-10-CM

## 2019-10-04 ENCOUNTER — Other Ambulatory Visit: Payer: Self-pay | Admitting: Physician Assistant

## 2019-10-04 DIAGNOSIS — N6489 Other specified disorders of breast: Secondary | ICD-10-CM

## 2019-10-04 DIAGNOSIS — R928 Other abnormal and inconclusive findings on diagnostic imaging of breast: Secondary | ICD-10-CM

## 2019-10-05 ENCOUNTER — Ambulatory Visit
Admission: RE | Admit: 2019-10-05 | Discharge: 2019-10-05 | Disposition: A | Discharge status: Home / Self Care | Payer: PRIVATE HEALTH INSURANCE | Source: Ambulatory Visit | Attending: Physician Assistant | Admitting: Physician Assistant

## 2019-10-05 ENCOUNTER — Other Ambulatory Visit: Payer: Self-pay

## 2019-10-05 DIAGNOSIS — R928 Other abnormal and inconclusive findings on diagnostic imaging of breast: Secondary | ICD-10-CM | POA: Diagnosis not present

## 2019-10-05 DIAGNOSIS — N6489 Other specified disorders of breast: Secondary | ICD-10-CM | POA: Diagnosis present

## 2020-10-04 ENCOUNTER — Other Ambulatory Visit: Payer: Self-pay | Admitting: Physician Assistant

## 2020-10-14 ENCOUNTER — Other Ambulatory Visit: Payer: Self-pay | Admitting: Physician Assistant

## 2020-10-14 DIAGNOSIS — Z1231 Encounter for screening mammogram for malignant neoplasm of breast: Secondary | ICD-10-CM

## 2020-11-03 DIAGNOSIS — E039 Hypothyroidism, unspecified: Secondary | ICD-10-CM | POA: Insufficient documentation

## 2020-11-03 DIAGNOSIS — E785 Hyperlipidemia, unspecified: Secondary | ICD-10-CM | POA: Insufficient documentation

## 2020-11-03 DIAGNOSIS — I10 Essential (primary) hypertension: Secondary | ICD-10-CM | POA: Insufficient documentation

## 2021-01-12 DIAGNOSIS — M8588 Other specified disorders of bone density and structure, other site: Secondary | ICD-10-CM | POA: Insufficient documentation

## 2021-01-21 ENCOUNTER — Ambulatory Visit: Payer: Medicare Other | Admitting: Nurse Practitioner

## 2021-03-12 ENCOUNTER — Ambulatory Visit
Admission: RE | Admit: 2021-03-12 | Discharge: 2021-03-12 | Disposition: A | Discharge status: Home / Self Care | Payer: Medicare Other | Source: Ambulatory Visit | Attending: Physician Assistant | Admitting: Physician Assistant

## 2021-03-12 ENCOUNTER — Other Ambulatory Visit: Payer: Self-pay

## 2021-03-12 DIAGNOSIS — Z1231 Encounter for screening mammogram for malignant neoplasm of breast: Secondary | ICD-10-CM | POA: Diagnosis not present

## 2021-04-01 ENCOUNTER — Ambulatory Visit
Admission: RE | Admit: 2021-04-01 | Discharge: 2021-04-01 | Disposition: A | Discharge status: Home / Self Care | Payer: Medicare Other | Source: Ambulatory Visit | Attending: Physician Assistant | Admitting: Physician Assistant

## 2021-04-01 ENCOUNTER — Other Ambulatory Visit: Payer: Self-pay | Admitting: Physician Assistant

## 2021-04-01 ENCOUNTER — Ambulatory Visit
Admission: RE | Admit: 2021-04-01 | Discharge: 2021-04-01 | Disposition: A | Discharge status: Home / Self Care | Payer: Medicare Other | Attending: Physician Assistant | Admitting: Physician Assistant

## 2021-04-01 ENCOUNTER — Other Ambulatory Visit: Payer: Self-pay

## 2021-04-01 DIAGNOSIS — R52 Pain, unspecified: Secondary | ICD-10-CM | POA: Insufficient documentation

## 2021-07-11 DIAGNOSIS — S81851A Open bite, right lower leg, initial encounter: Secondary | ICD-10-CM | POA: Diagnosis not present

## 2021-07-16 DIAGNOSIS — S81851A Open bite, right lower leg, initial encounter: Secondary | ICD-10-CM | POA: Diagnosis not present

## 2021-10-06 ENCOUNTER — Ambulatory Visit (INDEPENDENT_AMBULATORY_CARE_PROVIDER_SITE_OTHER): Payer: Medicare Other | Admitting: Family Medicine

## 2021-10-06 ENCOUNTER — Encounter: Payer: Self-pay | Admitting: Family Medicine

## 2021-10-06 ENCOUNTER — Telehealth: Payer: Self-pay

## 2021-10-06 VITALS — BP 138/86 | HR 81 | Temp 97.9°F | Ht 62.0 in | Wt 155.4 lb

## 2021-10-06 DIAGNOSIS — Z1159 Encounter for screening for other viral diseases: Secondary | ICD-10-CM

## 2021-10-06 DIAGNOSIS — M8588 Other specified disorders of bone density and structure, other site: Secondary | ICD-10-CM | POA: Diagnosis not present

## 2021-10-06 DIAGNOSIS — Z85828 Personal history of other malignant neoplasm of skin: Secondary | ICD-10-CM | POA: Diagnosis not present

## 2021-10-06 DIAGNOSIS — Z1211 Encounter for screening for malignant neoplasm of colon: Secondary | ICD-10-CM

## 2021-10-06 DIAGNOSIS — Z23 Encounter for immunization: Secondary | ICD-10-CM | POA: Diagnosis not present

## 2021-10-06 DIAGNOSIS — E039 Hypothyroidism, unspecified: Secondary | ICD-10-CM

## 2021-10-06 DIAGNOSIS — Z1231 Encounter for screening mammogram for malignant neoplasm of breast: Secondary | ICD-10-CM

## 2021-10-06 DIAGNOSIS — M25531 Pain in right wrist: Secondary | ICD-10-CM

## 2021-10-06 DIAGNOSIS — M25552 Pain in left hip: Secondary | ICD-10-CM

## 2021-10-06 DIAGNOSIS — E785 Hyperlipidemia, unspecified: Secondary | ICD-10-CM

## 2021-10-06 DIAGNOSIS — I10 Essential (primary) hypertension: Secondary | ICD-10-CM | POA: Diagnosis not present

## 2021-10-06 LAB — MICROALBUMIN, URINE WAIVED
Creatinine, Urine Waived: 50 mg/dL (ref 10–300)
Microalb, Ur Waived: 10 mg/L (ref 0–19)
Microalb/Creat Ratio: <30 mg/g (ref <30)

## 2021-10-06 LAB — URINALYSIS, ROUTINE W REFLEX MICROSCOPIC
Bilirubin, UA: NEGATIVE
Glucose, UA: NEGATIVE
Ketones, UA: NEGATIVE
Leukocytes,UA: NEGATIVE
Nitrite, UA: NEGATIVE
Protein,UA: NEGATIVE
Specific Gravity, UA: 1.015 (ref 1.005–1.030)
Urobilinogen, Ur: 0.2 mg/dL (ref 0.2–1.0)
pH, UA: 7 (ref 5.0–7.5)

## 2021-10-06 LAB — MICROSCOPIC EXAMINATION
Bacteria, UA: NONE SEEN
Epithelial Cells (non renal): NONE SEEN /hpf (ref 0–10)

## 2021-10-06 MED ORDER — TRAZODONE HCL 100 MG PO TABS: 100.0000 mg | ORAL_TABLET | Freq: Every day | ORAL | 1 refills | Status: DC

## 2021-10-06 MED ORDER — POTASSIUM CHLORIDE CRYS ER 10 MEQ PO TBCR: 10.0000 meq | EXTENDED_RELEASE_TABLET | Freq: Every day | ORAL | 1 refills | Status: DC

## 2021-10-06 MED ORDER — ATORVASTATIN CALCIUM 20 MG PO TABS: 20.0000 mg | ORAL_TABLET | Freq: Every day | ORAL | 1 refills | Status: DC

## 2021-10-06 MED ORDER — HYDROCHLOROTHIAZIDE 25 MG PO TABS: 25.0000 mg | ORAL_TABLET | Freq: Every day | ORAL | 1 refills | Status: DC

## 2021-10-06 NOTE — Telephone Encounter (Signed)
Patient plans to check with her insurance prior to scheduling.  Thanks,  [place], [place]

## 2021-10-06 NOTE — Assessment & Plan Note (Signed)
Due for repeat DEXA. Will get with mammo in the spring. Ordered today.

## 2021-10-06 NOTE — Assessment & Plan Note (Signed)
Rechecking labs today. Await results. Treat as needed.  °

## 2021-10-06 NOTE — Progress Notes (Signed)
BP 138/86   Pulse 81   Temp 97.9 F (36.6 C)   Ht 5\' 2"  (1.575 m)   Wt 155 lb 6.4 oz (70.5 kg)   SpO2 97%   BMI 28.42 kg/m    Subjective:    Patient ID: , female    DOB: [birth year], 71 y.o.   MRN: [id number]  HPI: Alexandria Craig is a 71 y.o. female who presents today to establish care  Chief Complaint  Patient presents with   Establish Care    Patient here to establish care today, was previously being seen at [facility].    Hypertension   Hyperlipidemia   HYPERTENSION / HYPERLIPIDEMIA Satisfied with current treatment? yes Duration of hypertension: chronic BP monitoring frequency: a few times a week -120s/60 at home BP medication side effects: no Past BP meds: benazepril, HCTZ Duration of hyperlipidemia: chronic Cholesterol medication side effects: no Cholesterol supplements: fish oil Past cholesterol medications: atorvastatin Medication compliance: excellent compliance Aspirin: no Recent stressors: no Recurrent headaches: no Visual changes: no Palpitations: no Dyspnea: no Chest pain: no Lower extremity edema: no Dizzy/lightheaded: no  HYPOTHYROIDISM Thyroid control status:stable Satisfied with current treatment? yes Medication side effects: no Medication compliance: excellent compliance Recent dose adjustment:no Fatigue: yes- especially in the AM Cold intolerance: no Heat intolerance: no Weight gain: no Weight loss: no Constipation: no Diarrhea/loose stools: no Palpitations: no Lower extremity edema: no Anxiety/depressed mood: no   Active Ambulatory Problems    Diagnosis Date Noted   Hyperlipidemia 11/03/2020   Hypertension 11/03/2020   Hypothyroidism 11/03/2020   Osteopenia of lumbar spine 01/12/2021   History of skin cancer 10/06/2021   Resolved Ambulatory Problems    Diagnosis Date Noted   No Resolved Ambulatory Problems   Past Medical History:  Diagnosis Date   Thyroid disease    Past Surgical History:   Procedure Laterality Date   CATARACT EXTRACTION W/PHACO Right 06/14/2019   Procedure: CATARACT EXTRACTION PHACO AND INTRAOCULAR LENS PLACEMENT (IOC) RIGHT;  Surgeon: 08/14/2019, MD;  Location: [facility];  Service: Ophthalmology;  Laterality: Right;  8.13, 0:50, 16.1%   CATARACT EXTRACTION W/PHACO Left 07/12/2019   Procedure: CATARACT EXTRACTION PHACO AND INTRAOCULAR LENS PLACEMENT (IOC) LEFT 5.49 00:51.8 10.6%;  Surgeon: 09/12/2019, MD;  Location: [facility];  Service: Ophthalmology;  Laterality: Left;   CESAREAN SECTION     COLON SURGERY     COLONOSCOPY     Outpatient Encounter Medications as of 10/06/2021  Medication Sig   Biotin 12/06/2021 MCG TABS Take by mouth.   Calcium Carbonate (CALCIUM 600 PO) Take by mouth daily.   Cholecalciferol (VITAMIN D3) 50 MCG (2000 UT) CAPS Take by mouth 2 (two) times daily.   Coenzyme Q10 (COQ10) 200 MG CAPS Take by mouth.   levothyroxine (SYNTHROID, LEVOTHROID) 75 MCG tablet Take 75 mcg by mouth daily before breakfast.   Menaquinone-7 (VITAMIN K2) 100 MCG CAPS Take by mouth.   Multiple Vitamin (MULTIVITAMIN) tablet Take 1 tablet by mouth daily.   Multiple Vitamins-Minerals (MULTIVITAMIN WITH MINERALS) tablet Take 1 tablet by mouth daily.   Omega-3 Fatty Acids (FISH OIL) 1000 MG CAPS Take by mouth.   polyethylene glycol powder (CLEARLAX) 17 GM/SCOOP powder Take 1 Container by mouth once.   UNABLE TO FIND Med Name: Gut Health Prebiotic and Probiotic   zinc gluconate 50 MG tablet Take 50 mg by mouth daily.   [DISCONTINUED] atorvastatin (LIPITOR) 20 MG tablet Take 20 mg by mouth daily.   [DISCONTINUED] hydrochlorothiazide (  HYDRODIURIL) 25 MG tablet Take 25 mg by mouth daily.   [DISCONTINUED] potassium chloride (KLOR-CON M) 10 MEQ tablet Take 10 mEq by mouth once.   [DISCONTINUED] traZODone (DESYREL) 100 MG tablet Take 100 mg by mouth at bedtime.   atorvastatin (LIPITOR) 20 MG tablet Take 1 tablet (20 mg total) by mouth daily.    hydrochlorothiazide (HYDRODIURIL) 25 MG tablet Take 1 tablet (25 mg total) by mouth daily.   potassium chloride (KLOR-CON M) 10 MEQ tablet Take 1 tablet (10 mEq total) by mouth daily.   traZODone (DESYREL) 100 MG tablet Take 1 tablet (100 mg total) by mouth at bedtime.   [DISCONTINUED] acetaminophen (TYLENOL) 325 MG tablet Take 650 mg by mouth every 6 (six) hours as needed.   [DISCONTINUED] aspirin EC 81 MG tablet Take 81 mg by mouth daily.   [DISCONTINUED] benazepril (LOTENSIN) 20 MG tablet Take 20 mg by mouth daily.   [DISCONTINUED] Bioflavonoids (BIOFLAVONOID-1000) 1000 MG TABS Take by mouth daily.   [DISCONTINUED] diphenhydrAMINE-PE-APAP 12.5-5-325 MG TABS Take by mouth daily.   [DISCONTINUED] traZODone (DESYREL) 50 MG tablet Take 50 mg by mouth at bedtime. (Patient not taking: Reported on 10/06/2021)   No facility-administered encounter medications on file as of 10/06/2021.   No Known Allergies  Social History   Socioeconomic History   Marital status: Married    Spouse name: Not on file   Number of children: Not on file   Years of education: Not on file   Highest education level: Not on file  Occupational History   Not on file  Tobacco Use   Smoking status: Never   Smokeless tobacco: Never  Vaping Use   Vaping Use: Never used  Substance and Sexual Activity   Alcohol use: No    Comment: may have 1 glass wine/month   Drug use: No   Sexual activity: Not on file  Other Topics Concern   Not on file  Social History Narrative   Not on file   Social Determinants of Health   Financial Resource Strain: Not on file  Food Insecurity: Not on file  Transportation Needs: Not on file  Physical Activity: Not on file  Stress: Not on file  Social Connections: Not on file   Family History  Problem Relation Age of Onset   Breast cancer Mother 63    Review of Systems  Constitutional:  Positive for fatigue. Negative for activity change, appetite change, chills, diaphoresis, fever  and unexpected weight change.  HENT: Negative.    Respiratory: Negative.    Cardiovascular: Negative.   Gastrointestinal: Negative.   Musculoskeletal:  Positive for arthralgias (R hip since her fracture and L wrist). Negative for back pain, gait problem, joint swelling, myalgias, neck pain and neck stiffness.  Skin: Negative.   Psychiatric/Behavioral: Negative.      Per HPI unless specifically indicated above     Objective:    BP 138/86   Pulse 81   Temp 97.9 F (36.6 C)   Ht 5\' 2"  (1.575 m)   Wt 155 lb 6.4 oz (70.5 kg)   SpO2 97%   BMI 28.42 kg/m   Wt Readings from Last 3 Encounters:  10/06/21 155 lb 6.4 oz (70.5 kg)  07/12/19 155 lb (70.3 kg)  06/14/19 156 lb (70.8 kg)    Physical Exam Vitals and nursing note reviewed.  Constitutional:      General: She is not in acute distress.    Appearance: Normal appearance. She is normal weight. She is not ill-appearing,  toxic-appearing or diaphoretic.  HENT:     Head: Normocephalic and atraumatic.     Right Ear: External ear normal.     Left Ear: External ear normal.     Nose: Nose normal.     Mouth/Throat:     Mouth: Mucous membranes are moist.     Pharynx: Oropharynx is clear.  Eyes:     General: No scleral icterus.       Right eye: No discharge.        Left eye: No discharge.     Extraocular Movements: Extraocular movements intact.     Conjunctiva/sclera: Conjunctivae normal.     Pupils: Pupils are equal, round, and reactive to light.  Cardiovascular:     Rate and Rhythm: Normal rate and regular rhythm.     Pulses: Normal pulses.     Heart sounds: Normal heart sounds. No murmur heard.    No friction rub. No gallop.  Pulmonary:     Effort: Pulmonary effort is normal. No respiratory distress.     Breath sounds: Normal breath sounds. No stridor. No wheezing, rhonchi or rales.  Chest:     Chest wall: No tenderness.  Musculoskeletal:        General: Normal range of motion.     Cervical back: Normal range of motion  and neck supple.  Skin:    General: Skin is warm and dry.     Capillary Refill: Capillary refill takes less than 2 seconds.     Coloration: Skin is not jaundiced or pale.     Findings: No bruising, erythema, lesion or rash.  Neurological:     General: No focal deficit present.     Mental Status: She is alert and oriented to person, place, and time. Mental status is at baseline.  Psychiatric:        Mood and Affect: Mood normal.        Behavior: Behavior normal.        Thought Content: Thought content normal.        Judgment: Judgment normal.     Results for orders placed or performed during the hospital encounter of 06/12/19  SARS CORONAVIRUS 2 (TAT 6-24 HRS) Nasopharyngeal Nasopharyngeal Swab   Specimen: Nasopharyngeal Swab  Result Value Ref Range   SARS Coronavirus 2 NEGATIVE NEGATIVE      Assessment & Plan:   Problem List Items Addressed This Visit       Cardiovascular and Mediastinum   Hypertension    Under good control on current regimen. Continue current regimen. Continue to monitor. Call with any concerns. Refills given. Labs drawn today.       Relevant Medications   atorvastatin (LIPITOR) 20 MG tablet   hydrochlorothiazide (HYDRODIURIL) 25 MG tablet   Other Relevant Orders   CBC with Differential/Platelet   Comprehensive metabolic panel   Urinalysis, Routine w reflex microscopic   Microalbumin, Urine Waived     Endocrine   Hypothyroidism - Primary    Rechecking labs today. Await results. Treat as needed.       Relevant Orders   CBC with Differential/Platelet   Comprehensive metabolic panel   TSH     Musculoskeletal and Integument   Osteopenia of lumbar spine    Due for repeat DEXA. Will get with mammo in the spring. Ordered today.      Relevant Orders   DG Bone Density     Other   Hyperlipidemia    Under good control on current regimen. Continue current regimen.  Continue to monitor. Call with any concerns. Refills given. Labs drawn today.        Relevant Medications   atorvastatin (LIPITOR) 20 MG tablet   hydrochlorothiazide (HYDRODIURIL) 25 MG tablet   Other Relevant Orders   CBC with Differential/Platelet   Comprehensive metabolic panel   Lipid Panel w/o Chol/HDL Ratio   History of skin cancer    Continue to follow with dermatology. Call with any concerns.       Other Visit Diagnoses     Pain of left hip       Will start voltaren. Call if not getting better or getting worse.    Right wrist pain       Will start voltaren. Call if not getting better or getting worse.    Need for hepatitis C screening test       Labs drawn today. Await results.    Relevant Orders   Hepatitis C Antibody   Need for influenza vaccination       Flu shot given today.   Relevant Orders   Flu Vaccine QUAD High Dose(Fluad)   Screening for colon cancer       Relevant Orders   Ambulatory referral to Gastroenterology   Encounter for screening mammogram for malignant neoplasm of breast       Mammo ordered today.   Relevant Orders   MM 3D SCREEN BREAST BILATERAL        Follow up plan: Return in about 6 months (around 04/07/2022), or Physical, for records release from [facility] please.

## 2021-10-06 NOTE — Assessment & Plan Note (Signed)
Under good control on current regimen. Continue current regimen. Continue to monitor. Call with any concerns. Refills given. Labs drawn today.   

## 2021-10-06 NOTE — Assessment & Plan Note (Signed)
Continue to follow with dermatology. Call with any concerns.  

## 2021-10-07 ENCOUNTER — Telehealth: Payer: Self-pay

## 2021-10-07 ENCOUNTER — Other Ambulatory Visit: Payer: Self-pay

## 2021-10-07 ENCOUNTER — Encounter: Payer: Self-pay | Admitting: Family Medicine

## 2021-10-07 DIAGNOSIS — Z1211 Encounter for screening for malignant neoplasm of colon: Secondary | ICD-10-CM

## 2021-10-07 LAB — COMPREHENSIVE METABOLIC PANEL
ALT: 15 IU/L (ref 0–32)
AST: 23 IU/L (ref 0–40)
Albumin/Globulin Ratio: 2.4 — ABNORMAL HIGH (ref 1.2–2.2)
Albumin: 4.8 g/dL (ref 3.9–4.9)
Alkaline Phosphatase: 57 IU/L (ref 44–121)
BUN/Creatinine Ratio: 16 (ref 12–28)
BUN: 13 mg/dL (ref 8–27)
Bilirubin Total: 0.7 mg/dL (ref 0.0–1.2)
CO2: 27 mmol/L (ref 20–29)
Calcium: 10.3 mg/dL (ref 8.7–10.3)
Chloride: 101 mmol/L (ref 96–106)
Creatinine, Ser: 0.79 mg/dL (ref 0.57–1.00)
Globulin, Total: 2 g/dL (ref 1.5–4.5)
Glucose: 86 mg/dL (ref 70–99)
Potassium: 3.7 mmol/L (ref 3.5–5.2)
Sodium: 142 mmol/L (ref 134–144)
Total Protein: 6.8 g/dL (ref 6.0–8.5)
eGFR: 81 mL/min/{1.73_m2} (ref >59)

## 2021-10-07 LAB — CBC WITH DIFFERENTIAL/PLATELET
Basophils Absolute: 0.1 10*3/uL (ref 0.0–0.2)
Basos: 1 %
EOS (ABSOLUTE): 0.1 10*3/uL (ref 0.0–0.4)
Eos: 1 %
Hematocrit: 41.8 % (ref 34.0–46.6)
Hemoglobin: 14 g/dL (ref 11.1–15.9)
Immature Grans (Abs): 0 10*3/uL (ref 0.0–0.1)
Immature Granulocytes: 0 %
Lymphocytes Absolute: 2.6 10*3/uL (ref 0.7–3.1)
Lymphs: 33 %
MCH: 28.8 pg (ref 26.6–33.0)
MCHC: 33.5 g/dL (ref 31.5–35.7)
MCV: 86 fL (ref 79–97)
Monocytes Absolute: 0.6 10*3/uL (ref 0.1–0.9)
Monocytes: 7 %
Neutrophils Absolute: 4.5 10*3/uL (ref 1.4–7.0)
Neutrophils: 58 %
Platelets: 254 10*3/uL (ref 150–450)
RBC: 4.86 x10E6/uL (ref 3.77–5.28)
RDW: 13 % (ref 11.7–15.4)
WBC: 7.8 10*3/uL (ref 3.4–10.8)

## 2021-10-07 LAB — LIPID PANEL W/O CHOL/HDL RATIO
Cholesterol, Total: 142 mg/dL (ref 100–199)
HDL: 66 mg/dL (ref >39)
LDL Chol Calc (NIH): 58 mg/dL (ref 0–99)
Triglycerides: 97 mg/dL (ref 0–149)
VLDL Cholesterol Cal: 18 mg/dL (ref 5–40)

## 2021-10-07 LAB — HEPATITIS C ANTIBODY: Hep C Virus Ab: NONREACTIVE

## 2021-10-07 LAB — TSH: TSH: 3.06 u[IU]/mL (ref 0.450–4.500)

## 2021-10-07 MED ORDER — CLENPIQ 10-3.5-12 MG-GM -GM/160ML PO SOLN: 2.0000 | Freq: Once | ORAL | 0 refills | Status: AC

## 2021-10-07 NOTE — Telephone Encounter (Signed)
Gastroenterology Pre-Procedure Review  Request Date: 11/06/21 Requesting Physician: Dr. Vicente Males  PATIENT REVIEW QUESTIONS: The patient responded to the following health history questions as indicated:    1. Are you having any GI issuesyes (constipation has been drinking a tea gut health, and miralax has bowel movements 2-3 times a week) I've advised a 2 day prep for her using ClenPiq 2. Do you have a personal history of Polyps? no 3. Do you have a family history of Colon Cancer or Polyps? no 4. Diabetes Mellitus? no 5. Joint replacements in the past 12 months?no 6. Major health problems in the past 3 months?no 7. Any artificial heart valves, MVP, or defibrillator?no    MEDICATIONS & ALLERGIES:    Patient reports the following regarding taking any anticoagulation/antiplatelet therapy:   Plavix, Coumadin, Eliquis, Xarelto, Lovenox, Pradaxa, Brilinta, or Effient? no Aspirin? no  Patient confirms/reports the following medications:  Current Outpatient Medications  Medication Sig Dispense Refill   atorvastatin (LIPITOR) 20 MG tablet Take 1 tablet (20 mg total) by mouth daily. 90 tablet 1   Biotin 10000 MCG TABS Take by mouth.     Calcium Carbonate (CALCIUM 600 PO) Take by mouth daily.     Cholecalciferol (VITAMIN D3) 50 MCG (2000 UT) CAPS Take by mouth 2 (two) times daily.     Coenzyme Q10 (COQ10) 200 MG CAPS Take by mouth.     hydrochlorothiazide (HYDRODIURIL) 25 MG tablet Take 1 tablet (25 mg total) by mouth daily. 90 tablet 1   levothyroxine (SYNTHROID, LEVOTHROID) 75 MCG tablet Take 75 mcg by mouth daily before breakfast.     Menaquinone-7 (VITAMIN K2) 100 MCG CAPS Take by mouth.     Multiple Vitamin (MULTIVITAMIN) tablet Take 1 tablet by mouth daily.     Multiple Vitamins-Minerals (MULTIVITAMIN WITH MINERALS) tablet Take 1 tablet by mouth daily.     Omega-3 Fatty Acids (FISH OIL) 1000 MG CAPS Take by mouth.     polyethylene glycol powder (CLEARLAX) 17 GM/SCOOP powder Take 1 Container by  mouth once.     potassium chloride (KLOR-CON M) 10 MEQ tablet Take 1 tablet (10 mEq total) by mouth daily. 90 tablet 1   traZODone (DESYREL) 100 MG tablet Take 1 tablet (100 mg total) by mouth at bedtime. 90 tablet 1   UNABLE TO FIND Med Name: Gut Health Prebiotic and Probiotic     zinc gluconate 50 MG tablet Take 50 mg by mouth daily.     No current facility-administered medications for this visit.    Patient confirms/reports the following allergies:  No Known Allergies  No orders of the defined types were placed in this encounter.   AUTHORIZATION INFORMATION Primary Insurance: 1D#: Group #:  Secondary Insurance: 1D#: Group #:  SCHEDULE INFORMATION: Date: 11/06/21 Time: Location: [facility]

## 2021-11-05 ENCOUNTER — Encounter: Payer: Self-pay | Admitting: Gastroenterology

## 2021-11-06 ENCOUNTER — Encounter
Admission: RE | Disposition: A | Discharge status: Home / Self Care | Payer: Self-pay | Source: Home / Self Care | Attending: Gastroenterology

## 2021-11-06 ENCOUNTER — Ambulatory Visit: Payer: Medicare Other | Admitting: Anesthesiology

## 2021-11-06 ENCOUNTER — Encounter: Payer: Self-pay | Admitting: Gastroenterology

## 2021-11-06 ENCOUNTER — Ambulatory Visit
Admission: RE | Admit: 2021-11-06 | Discharge: 2021-11-06 | Disposition: A | Discharge status: Home / Self Care | Payer: Medicare Other | Attending: Gastroenterology | Admitting: Gastroenterology

## 2021-11-06 DIAGNOSIS — Z1211 Encounter for screening for malignant neoplasm of colon: Secondary | ICD-10-CM

## 2021-11-06 DIAGNOSIS — I10 Essential (primary) hypertension: Secondary | ICD-10-CM | POA: Diagnosis not present

## 2021-11-06 DIAGNOSIS — E039 Hypothyroidism, unspecified: Secondary | ICD-10-CM | POA: Diagnosis not present

## 2021-11-06 DIAGNOSIS — K649 Unspecified hemorrhoids: Secondary | ICD-10-CM | POA: Diagnosis not present

## 2021-11-06 HISTORY — PX: COLONOSCOPY WITH PROPOFOL: SHX5780

## 2021-11-06 SURGERY — COLONOSCOPY WITH PROPOFOL
Anesthesia: General

## 2021-11-06 MED ORDER — LIDOCAINE HCL (CARDIAC) PF 100 MG/5ML IV SOSY
PREFILLED_SYRINGE | INTRAVENOUS | Status: DC | PRN
  Administered 2021-11-06: 50 mg via INTRAVENOUS

## 2021-11-06 MED ORDER — SODIUM CHLORIDE 0.9 % IV SOLN: INTRAVENOUS | Status: DC

## 2021-11-06 MED ORDER — PROPOFOL 10 MG/ML IV BOLUS
INTRAVENOUS | Status: DC | PRN
  Administered 2021-11-06: 60 mg via INTRAVENOUS

## 2021-11-06 MED ORDER — PROPOFOL 500 MG/50ML IV EMUL
INTRAVENOUS | Status: DC | PRN
  Administered 2021-11-06: 150 ug/kg/min via INTRAVENOUS

## 2021-11-06 NOTE — Anesthesia Postprocedure Evaluation (Signed)
Anesthesia Post Note  Patient: Alexandria Craig  Procedure(s) Performed: COLONOSCOPY WITH PROPOFOL  Patient location during evaluation: PACU Anesthesia Type: General Level of consciousness: awake and alert Pain management: pain level controlled Vital Signs Assessment: post-procedure vital signs reviewed and stable Respiratory status: spontaneous breathing, nonlabored ventilation and respiratory function stable Cardiovascular status: blood pressure returned to baseline and stable Postop Assessment: no apparent nausea or vomiting Anesthetic complications: no   No notable events documented.   Last Vitals:  Vitals:   11/06/21 0928 11/06/21 0938  BP: (!) 100/48 122/60  Pulse: 61 63  Resp: 12 17  Temp:    SpO2: 100% 100%    Last Pain:  Vitals:   11/06/21 0938  TempSrc:   PainSc: 0-No pain                 Iran Ouch

## 2021-11-06 NOTE — Op Note (Signed)
[facility] Gastroenterology Patient Name: Alexandria Craig Procedure Date: 11/06/2021 8:56 AM MRN: [id number] Account #: [id number] Date of Birth: [birth year] Admit Type: Outpatient Age: 71 Room: [facility] ENDO ROOM 3 Gender: Female Note Status: Finalized Instrument Name: Jasper Riling [id number] Procedure:             Colonoscopy Indications:           Screening for colorectal malignant neoplasm Providers:             Jonathon Bellows MD, MD Referring MD:          Valerie Roys (Referring MD) Medicines:             Monitored Anesthesia Care Complications:         No immediate complications. Procedure:             Pre-Anesthesia Assessment:                        - Prior to the procedure, a History and Physical was                         performed, and patient medications, allergies and                         sensitivities were reviewed. The patient's tolerance                         of previous anesthesia was reviewed.                        - The risks and benefits of the procedure and the                         sedation options and risks were discussed with the                         patient. All questions were answered and informed                         consent was obtained.                        - ASA Grade Assessment: II - A patient with mild                         systemic disease.                        After obtaining informed consent, the colonoscope was                         passed under direct vision. Throughout the procedure,                         the patient's blood pressure, pulse, and oxygen                         saturations were monitored continuously. The                         Colonoscope was introduced  through the anus and                         advanced to the the cecum, identified by the                         appendiceal orifice. The colonoscopy was performed                         with ease. The patient tolerated the procedure well.                          The quality of the bowel preparation was excellent.                         The appendiceal orifice was photographed. Findings:      The perianal and digital rectal examinations were normal.      The entire examined colon appeared normal. Impression:            - The entire examined colon is normal.                        - No specimens collected. Recommendation:        - Discharge patient to home (with escort).                        - Resume previous diet.                        - Continue present medications.                        - Repeat colonoscopy is not recommended due to current                         age (33 years or older) for screening purposes. Procedure Code(s):     --- Professional ---                        [phone], Colonoscopy, flexible; diagnostic, including                         collection of specimen(s) by brushing or washing, when                         performed (separate procedure) Diagnosis Code(s):     --- Professional ---                        Z12.11, Encounter for screening for malignant neoplasm                         of colon CPT copyright 2022 American [employer] Association. All rights reserved. The codes documented in this report are preliminary and upon coder review may  be revised to meet current compliance requirements. Jonathon Bellows, MD Jonathon Bellows MD, MD 11/06/2021 9:15:32 AM This report has been signed electronically. Number of Addenda: 0 Note Initiated On: 11/06/2021 8:56 AM Scope Withdrawal Time: 0 hours 7 minutes 8 seconds  Total Procedure Duration: 0 hours 9 minutes 54 seconds  Estimated Blood Loss:  Estimated blood loss: none.      [facility]

## 2021-11-06 NOTE — Transfer of Care (Signed)
Immediate Anesthesia Transfer of Care Note  Patient: GEORGIANNE GRITZ  Procedure(s) Performed: COLONOSCOPY WITH PROPOFOL  Patient Location:    Anesthesia Type:General  Level of Consciousness: drowsy  Airway & Oxygen Therapy: Patient Spontanous Breathing  Post-op Assessment: Report given to RN and Post -op Vital signs reviewed and stable  Post vital signs: Reviewed and stable  Last Vitals:  Vitals Value Taken Time  BP 100/47 11/06/21 0918  Temp    Pulse 68 11/06/21 0917  Resp 12 11/06/21 0917  SpO2 99 % 11/06/21 0917  Vitals shown include unvalidated device data.  Last Pain:  Vitals:   11/06/21 0755  TempSrc: Temporal  PainSc: 0-No pain         Complications: No notable events documented.

## 2021-11-06 NOTE — Anesthesia Preprocedure Evaluation (Addendum)
Anesthesia Evaluation  Patient identified by MRN, date of birth, ID band Patient awake    Reviewed: Allergy & Precautions, NPO status , Patient's Chart, lab work & pertinent test results, reviewed documented beta blocker date and time   History of Anesthesia Complications Negative for: history of anesthetic complications  Airway Mallampati: II  TM Distance: >3 FB Neck ROM: Full    Dental no notable dental hx.    Pulmonary neg pulmonary ROS   Pulmonary exam normal breath sounds clear to auscultation       Cardiovascular Exercise Tolerance: Good hypertension, Pt. on medications (-) angina (-) DOE Normal cardiovascular exam Rhythm:Regular Rate:Normal     Neuro/Psych negative neurological ROS  negative psych ROS   GI/Hepatic negative GI ROS, Neg liver ROS,neg GERD  ,,  Endo/Other  Hypothyroidism    Renal/GU negative Renal ROS  negative genitourinary   Musculoskeletal negative musculoskeletal ROS (+)    Abdominal Normal abdominal exam  (+)   Peds negative pediatric ROS (+)  Hematology negative hematology ROS (+)   Anesthesia Other Findings   Reproductive/Obstetrics negative OB ROS                             Anesthesia Physical Anesthesia Plan  ASA: II  Anesthesia Plan: General   Post-op Pain Management: Minimal or no pain anticipated   Induction: Intravenous  PONV Risk Score and Plan: 2 and TIVA and Propofol infusion  Airway Management Planned: Nasal Cannula and Natural Airway  Additional Equipment: None  Intra-op Plan:   Post-operative Plan:   Informed Consent: I have reviewed the patients History and Physical, chart, labs and discussed the procedure including the risks, benefits and alternatives for the proposed anesthesia with the patient or authorized representative who has indicated his/her understanding and acceptance.     Dental advisory given  Plan Discussed  with: CRNA  Anesthesia Plan Comments:         Anesthesia Quick Evaluation

## 2021-11-06 NOTE — H&P (Signed)
[name], MD [address], [address], [place], [place], [address] [address], [place], [place], [address] Phone: [phone]  Fax: [phone]  Primary Care Physician:  Dorcas Carrow, DO   Pre-Procedure History & Physical: HPI:  Alexandria Craig is a 71 y.o. female is here for an colonoscopy.   Past Medical History:  Diagnosis Date   Hypertension    Hypothyroidism    Thyroid disease     Past Surgical History:  Procedure Laterality Date   CATARACT EXTRACTION W/PHACO Right 06/14/2019   Procedure: CATARACT EXTRACTION PHACO AND INTRAOCULAR LENS PLACEMENT (IOC) RIGHT;  Surgeon: Lockie Mola, MD;  Location: [facility];  Service: Ophthalmology;  Laterality: Right;  8.13, 0:50, 16.1%   CATARACT EXTRACTION W/PHACO Left 07/12/2019   Procedure: CATARACT EXTRACTION PHACO AND INTRAOCULAR LENS PLACEMENT (IOC) LEFT 5.49 00:51.8 10.6%;  Surgeon: Lockie Mola, MD;  Location: [facility];  Service: Ophthalmology;  Laterality: Left;   CESAREAN SECTION     COLON SURGERY     COLONOSCOPY     EYE SURGERY      Prior to Admission medications   Medication Sig Start Date End Date Taking? Authorizing Provider  atorvastatin (LIPITOR) 20 MG tablet Take 1 tablet (20 mg total) by mouth daily. 10/06/21  Yes Johnson, Megan P, DO  Biotin 89381 MCG TABS Take by mouth.   Yes [provider]  Calcium Carbonate (CALCIUM 600 PO) Take by mouth daily.   Yes [provider]  Cholecalciferol (VITAMIN D3) 50 MCG (2000 UT) CAPS Take by mouth 2 (two) times daily.   Yes [provider]  Coenzyme Q10 (COQ10) 200 MG CAPS Take by mouth.   Yes [provider]  hydrochlorothiazide (HYDRODIURIL) 25 MG tablet Take 1 tablet (25 mg total) by mouth daily. 10/06/21  Yes Johnson, Megan P, DO  levothyroxine (SYNTHROID, LEVOTHROID) 75 MCG tablet Take 75 mcg by mouth daily before breakfast.   Yes [provider]  Menaquinone-7 (VITAMIN  K2) 100 MCG CAPS Take by mouth.   Yes [provider]  Multiple Vitamin (MULTIVITAMIN) tablet Take 1 tablet by mouth daily.   Yes [provider]  Multiple Vitamins-Minerals (MULTIVITAMIN WITH MINERALS) tablet Take 1 tablet by mouth daily.   Yes [provider]  Omega-3 Fatty Acids (FISH OIL) 1000 MG CAPS Take by mouth.   Yes [provider]  potassium chloride (KLOR-CON M) 10 MEQ tablet Take 1 tablet (10 mEq total) by mouth daily. 10/06/21  Yes Johnson, Megan P, DO  zinc gluconate 50 MG tablet Take 50 mg by mouth daily.   Yes [provider]  polyethylene glycol powder (CLEARLAX) 17 GM/SCOOP powder Take 1 Container by mouth once.    [provider]  traZODone (DESYREL) 100 MG tablet Take 1 tablet (100 mg total) by mouth at bedtime. 10/06/21   Olevia Perches P, DO  UNABLE TO FIND Med Name: Gut Health Prebiotic and Probiotic    [provider]    Allergies as of 10/07/2021   (No Known Allergies)    Family History  Problem Relation Age of Onset   Breast cancer Mother 32    Social History   Socioeconomic History   Marital status: Married    Spouse name: Not on file   Number of children: Not on file   Years of education: Not on file   Highest education level: Not on file  Occupational History   Not on file  Tobacco Use  Smoking status: Never   Smokeless tobacco: Never  Vaping Use   Vaping Use: Never used  Substance and Sexual Activity   Alcohol use: No   Drug use: No   Sexual activity: Not on file  Other Topics Concern   Not on file  Social History Narrative   Not on file   Social Determinants of Health   Financial Resource Strain: Not on file  Food Insecurity: Not on file  Transportation Needs: Not on file  Physical Activity: Not on file  Stress: Not on file  Social Connections: Not on file  Intimate Partner Violence: Not on file    Review of Systems: See HPI, otherwise negative ROS  Physical  Exam: BP (!) 153/82   Pulse 88   Temp (!) 96.8 F (36 C) (Temporal)   Resp 16   Ht 5\' 2"  (1.575 m)   Wt 69.4 kg   SpO2 99%   BMI 27.98 kg/m  General:   Alert,  pleasant and cooperative in NAD Head:  Normocephalic and atraumatic. Neck:  Supple; no masses or thyromegaly. Lungs:  Clear throughout to auscultation, normal respiratory effort.    Heart:  +S1, +S2, Regular rate and rhythm, No edema. Abdomen:  Soft, nontender and nondistended. Normal bowel sounds, without guarding, and without rebound.   Neurologic:  Alert and  oriented x4;  grossly normal neurologically.  Impression/Plan: Alexandria Craig is here for an colonoscopy to be performed for Screening colonoscopy average risk   Risks, benefits, limitations, and alternatives regarding  colonoscopy have been reviewed with the patient.  Questions have been answered.  All parties agreeable.   Jonathon Bellows, MD  11/06/2021, 8:50 AM

## 2021-11-07 ENCOUNTER — Encounter: Payer: Self-pay | Admitting: Gastroenterology

## 2021-11-18 DIAGNOSIS — L821 Other seborrheic keratosis: Secondary | ICD-10-CM | POA: Diagnosis not present

## 2021-11-18 DIAGNOSIS — L298 Other pruritus: Secondary | ICD-10-CM | POA: Diagnosis not present

## 2021-11-18 DIAGNOSIS — Z85828 Personal history of other malignant neoplasm of skin: Secondary | ICD-10-CM | POA: Diagnosis not present

## 2021-11-18 DIAGNOSIS — Z872 Personal history of diseases of the skin and subcutaneous tissue: Secondary | ICD-10-CM | POA: Diagnosis not present

## 2021-11-18 DIAGNOSIS — L578 Other skin changes due to chronic exposure to nonionizing radiation: Secondary | ICD-10-CM | POA: Diagnosis not present

## 2021-11-18 DIAGNOSIS — L570 Actinic keratosis: Secondary | ICD-10-CM | POA: Diagnosis not present

## 2022-03-16 ENCOUNTER — Ambulatory Visit
Admission: RE | Admit: 2022-03-16 | Discharge: 2022-03-16 | Disposition: A | Discharge status: Home / Self Care | Payer: Medicare Other | Source: Ambulatory Visit | Attending: Family Medicine | Admitting: Family Medicine

## 2022-03-16 DIAGNOSIS — Z1231 Encounter for screening mammogram for malignant neoplasm of breast: Secondary | ICD-10-CM | POA: Diagnosis not present

## 2022-03-16 DIAGNOSIS — M8588 Other specified disorders of bone density and structure, other site: Secondary | ICD-10-CM | POA: Diagnosis not present

## 2022-03-16 DIAGNOSIS — M8589 Other specified disorders of bone density and structure, multiple sites: Secondary | ICD-10-CM | POA: Diagnosis not present

## 2022-03-30 DIAGNOSIS — H40003 Preglaucoma, unspecified, bilateral: Secondary | ICD-10-CM | POA: Diagnosis not present

## 2022-03-30 DIAGNOSIS — H43813 Vitreous degeneration, bilateral: Secondary | ICD-10-CM | POA: Diagnosis not present

## 2022-03-30 DIAGNOSIS — Z961 Presence of intraocular lens: Secondary | ICD-10-CM | POA: Diagnosis not present

## 2022-04-07 ENCOUNTER — Encounter: Payer: Self-pay | Admitting: Family Medicine

## 2022-04-07 ENCOUNTER — Ambulatory Visit (INDEPENDENT_AMBULATORY_CARE_PROVIDER_SITE_OTHER): Payer: Medicare Other | Admitting: Family Medicine

## 2022-04-07 VITALS — BP 138/72 | HR 99 | Temp 98.2°F | Ht 62.0 in | Wt 156.5 lb

## 2022-04-07 DIAGNOSIS — I10 Essential (primary) hypertension: Secondary | ICD-10-CM

## 2022-04-07 DIAGNOSIS — Z0000 Encounter for general adult medical examination without abnormal findings: Secondary | ICD-10-CM

## 2022-04-07 DIAGNOSIS — E039 Hypothyroidism, unspecified: Secondary | ICD-10-CM | POA: Diagnosis not present

## 2022-04-07 DIAGNOSIS — E782 Mixed hyperlipidemia: Secondary | ICD-10-CM

## 2022-04-07 DIAGNOSIS — D692 Other nonthrombocytopenic purpura: Secondary | ICD-10-CM

## 2022-04-07 LAB — MICROSCOPIC EXAMINATION: Bacteria, UA: NONE SEEN

## 2022-04-07 LAB — URINALYSIS, ROUTINE W REFLEX MICROSCOPIC
Bilirubin, UA: NEGATIVE
Glucose, UA: NEGATIVE
Ketones, UA: NEGATIVE
Leukocytes,UA: NEGATIVE
Nitrite, UA: NEGATIVE
Protein,UA: NEGATIVE
Specific Gravity, UA: 1.01 (ref 1.005–1.030)
Urobilinogen, Ur: 0.2 mg/dL (ref 0.2–1.0)
pH, UA: 7 (ref 5.0–7.5)

## 2022-04-07 LAB — MICROALBUMIN, URINE WAIVED
Creatinine, Urine Waived: 10 mg/dL (ref 10–300)
Microalb, Ur Waived: 10 mg/L (ref 0–19)
Microalb/Creat Ratio: <30 mg/g (ref <30)

## 2022-04-07 MED ORDER — ATORVASTATIN CALCIUM 20 MG PO TABS: 20.0000 mg | ORAL_TABLET | Freq: Every day | ORAL | 1 refills | Status: DC

## 2022-04-07 MED ORDER — HYDROCHLOROTHIAZIDE 25 MG PO TABS: 25.0000 mg | ORAL_TABLET | Freq: Every day | ORAL | 1 refills | Status: DC

## 2022-04-07 MED ORDER — POTASSIUM CHLORIDE CRYS ER 10 MEQ PO TBCR: 10.0000 meq | EXTENDED_RELEASE_TABLET | Freq: Every day | ORAL | 1 refills | Status: DC

## 2022-04-07 MED ORDER — TRAZODONE HCL 100 MG PO TABS: 100.0000 mg | ORAL_TABLET | Freq: Every day | ORAL | 1 refills | Status: DC

## 2022-04-07 NOTE — Assessment & Plan Note (Signed)
Will recheck labs. Await results. Treat as needed.  

## 2022-04-07 NOTE — Assessment & Plan Note (Signed)
Reassured patient. Continue to monitor.  

## 2022-04-07 NOTE — Assessment & Plan Note (Signed)
Under good control on current regimen. Continue current regimen. Continue to monitor. Call with any concerns. Refills given. Labs drawn today.   

## 2022-04-07 NOTE — Progress Notes (Signed)
BP 138/72 (BP Location: Left Arm, Cuff Size: Normal)   Pulse 99   Temp 98.2 F (36.8 C) (Oral)   Ht 5\' 2"  (1.575 m)   Wt 156 lb 8 oz (71 kg)   SpO2 96%   BMI 28.62 kg/m    Subjective:    Patient ID: Alexandria Craig, female    DOB: Apr 10, 1950, 71 y.o.   MRN: [id number]  HPI: Alexandria Craig is a 72 y.o. female presenting on 04/07/2022 for comprehensive medical examination. Current medical complaints include:  HYPERTENSION / HYPERLIPIDEMIA Satisfied with current treatment? yes Duration of hypertension: chronic BP monitoring frequency: not checking BP medication side effects: no Past BP meds: HCTZ Duration of hyperlipidemia: chronic Cholesterol medication side effects: no Cholesterol supplements: fish oil Past cholesterol medications: atorvastatin Medication compliance: excellent compliance Aspirin: no Recent stressors: no Recurrent headaches: no Visual changes: no Palpitations: no Dyspnea: no Chest pain: no Lower extremity edema: no Dizzy/lightheaded: no  HYPOTHYROIDISM Thyroid control status:controlled Satisfied with current treatment? yes Medication side effects: no Medication compliance: excellent compliance Recent dose adjustment:no Fatigue: no Cold intolerance: no Heat intolerance: no Weight gain: no Weight loss: no Constipation: no Diarrhea/loose stools: no Palpitations: no Lower extremity edema: no Anxiety/depressed mood: no  She currently lives with: husband Menopausal Symptoms: no  Functional Status Survey: Is the patient deaf or have difficulty hearing?: No Does the patient have difficulty seeing, even when wearing glasses/contacts?: No Does the patient have difficulty concentrating, remembering, or making decisions?: No Does the patient have difficulty walking or climbing stairs?: No Does the patient have difficulty dressing or bathing?: No Does the patient have difficulty doing errands alone such as visiting a doctor's office or  shopping?: No     04/07/2022    9:20 AM 10/06/2021    2:25 PM  [place] in the past year? 0 0  Number falls in past yr: 0 0  Injury with Fall? 0 0  Risk for fall due to : No Fall Risks No Fall Risks  Follow up Falls evaluation completed Falls evaluation completed    Depression Screen    04/07/2022    9:20 AM 10/06/2021    2:25 PM  Depression screen PHQ 2/9  Decreased Interest 0 0  Down, Depressed, Hopeless 0 0  PHQ - 2 Score 0 0  Altered sleeping 0 0  Tired, decreased energy 0 2  Change in appetite 0 0  Feeling bad or failure about yourself  0 0  Trouble concentrating 0 0  Moving slowly or fidgety/restless 0 0  Suicidal thoughts 0 0  PHQ-9 Score 0 2  Difficult doing work/chores Not difficult at all Not difficult at all   Advanced Directives Does patient have a HCPOA?    yes Does patient have a living will or MOST form?  yes  Past Medical History:  Past Medical History:  Diagnosis Date   Hypertension    Hypothyroidism    Thyroid disease     Surgical History:  Past Surgical History:  Procedure Laterality Date   CATARACT EXTRACTION W/PHACO Right 06/14/2019   Procedure: CATARACT EXTRACTION PHACO AND INTRAOCULAR LENS PLACEMENT ([place]) RIGHT;  Surgeon: Leandrew Koyanagi, MD;  Location: [facility];  Service: Ophthalmology;  Laterality: Right;  8.13, 0:50, 16.1%   CATARACT EXTRACTION W/PHACO Left 07/12/2019   Procedure: CATARACT EXTRACTION PHACO AND INTRAOCULAR LENS PLACEMENT (IOC) LEFT 5.49 00:51.8 10.6%;  Surgeon: Leandrew Koyanagi, MD;  Location: [facility];  Service: Ophthalmology;  Laterality: Left;  CESAREAN SECTION     COLON SURGERY     COLONOSCOPY     COLONOSCOPY WITH PROPOFOL N/A 11/06/2021   Procedure: COLONOSCOPY WITH PROPOFOL;  Surgeon: Jonathon Bellows, MD;  Location: [facility];  Service: Gastroenterology;  Laterality: N/A;   EYE SURGERY      Medications:  Current Outpatient Medications on File Prior to Visit  Medication Sig    Biotin 10000 MCG TABS Take by mouth.   Calcium Carbonate (CALCIUM 600 PO) Take by mouth daily.   Cholecalciferol (VITAMIN D3) 50 MCG (2000 UT) CAPS Take by mouth 2 (two) times daily.   Coenzyme Q10 (COQ10) 200 MG CAPS Take by mouth.   levothyroxine (SYNTHROID, LEVOTHROID) 75 MCG tablet Take 75 mcg by mouth daily before breakfast.   Menaquinone-7 (VITAMIN K2) 100 MCG CAPS Take by mouth.   Multiple Vitamin (MULTIVITAMIN) tablet Take 1 tablet by mouth daily.   Multiple Vitamins-Minerals (MULTIVITAMIN WITH MINERALS) tablet Take 1 tablet by mouth daily.   Omega-3 Fatty Acids (FISH OIL) 1000 MG CAPS Take by mouth.   polyethylene glycol powder (CLEARLAX) 17 GM/SCOOP powder Take 1 Container by mouth once.   UNABLE TO FIND Med Name: Gut Health Prebiotic and Probiotic   zinc gluconate 50 MG tablet Take 50 mg by mouth daily.   No current facility-administered medications on file prior to visit.    Allergies:  No Known Allergies  Social History:  Social History   Socioeconomic History   Marital status: Married    Spouse name: Not on file   Number of children: Not on file   Years of education: Not on file   Highest education level: Not on file  Occupational History   Not on file  Tobacco Use   Smoking status: Never   Smokeless tobacco: Never  Vaping Use   Vaping Use: Never used  Substance and Sexual Activity   Alcohol use: No   Drug use: No   Sexual activity: Not on file  Other Topics Concern   Not on file  Social History Narrative   Not on file   Social Determinants of Health   Financial Resource Strain: Not on file  Food Insecurity: Not on file  Transportation Needs: Not on file  Physical Activity: Not on file  Stress: Not on file  Social Connections: Not on file  Intimate Partner Violence: Not on file   Social History   Tobacco Use  Smoking Status Never  Smokeless Tobacco Never   Social History   Substance and Sexual Activity  Alcohol Use No    Family  History:  Family History  Problem Relation Age of Onset   Breast cancer Mother 40    Past medical history, surgical history, medications, allergies, family history and social history reviewed with patient today and changes made to appropriate areas of the chart.   Review of Systems  Constitutional: Negative.   HENT: Negative.    Eyes: Negative.   Respiratory: Negative.    Cardiovascular: Negative.   Gastrointestinal:  Positive for constipation. Negative for abdominal pain, blood in stool, diarrhea, heartburn, melena, nausea and vomiting.  Genitourinary: Negative.   Musculoskeletal:  Positive for myalgias. Negative for back pain, falls, joint pain and neck pain.  Skin: Negative.   Neurological: Negative.   Endo/Heme/Allergies:  Positive for environmental allergies. Negative for polydipsia. Bruises/bleeds easily.  Psychiatric/Behavioral: Negative.      All other ROS negative except what is listed above and in the HPI.      Objective:  BP 138/72 (BP Location: Left Arm, Cuff Size: Normal)   Pulse 99   Temp 98.2 F (36.8 C) (Oral)   Ht 5\' 2"  (1.575 m)   Wt 156 lb 8 oz (71 kg)   SpO2 96%   BMI 28.62 kg/m   Wt Readings from Last 3 Encounters:  04/07/22 156 lb 8 oz (71 kg)  11/06/21 153 lb (69.4 kg)  10/06/21 155 lb 6.4 oz (70.5 kg)     Physical Exam Vitals and nursing note reviewed.  Constitutional:      General: She is not in acute distress.    Appearance: Normal appearance. She is normal weight. She is not ill-appearing, toxic-appearing or diaphoretic.  HENT:     Head: Normocephalic and atraumatic.     Right Ear: Tympanic membrane, ear canal and external ear normal. There is no impacted cerumen.     Left Ear: Tympanic membrane, ear canal and external ear normal. There is no impacted cerumen.     Nose: Nose normal. No congestion or rhinorrhea.     Mouth/Throat:     Mouth: Mucous membranes are moist.     Pharynx: Oropharynx is clear. No oropharyngeal exudate or  posterior oropharyngeal erythema.  Eyes:     General: No scleral icterus.       Right eye: No discharge.        Left eye: No discharge.     Extraocular Movements: Extraocular movements intact.     Conjunctiva/sclera: Conjunctivae normal.     Pupils: Pupils are equal, round, and reactive to light.  Neck:     Vascular: No carotid bruit.  Cardiovascular:     Rate and Rhythm: Normal rate and regular rhythm.     Pulses: Normal pulses.     Heart sounds: No murmur heard.    No friction rub. No gallop.  Pulmonary:     Effort: Pulmonary effort is normal. No respiratory distress.     Breath sounds: Normal breath sounds. No stridor. No wheezing, rhonchi or rales.  Chest:     Chest wall: No tenderness.  Abdominal:     General: Abdomen is flat. Bowel sounds are normal. There is no distension.     Palpations: Abdomen is soft. There is no mass.     Tenderness: There is no abdominal tenderness. There is no right CVA tenderness, left CVA tenderness, guarding or rebound.     Hernia: No hernia is present.  Genitourinary:    Comments: Breast and pelvic exams deferred with shared decision making Musculoskeletal:        General: No swelling, tenderness, deformity or signs of injury.     Cervical back: Normal range of motion and neck supple. No rigidity. No muscular tenderness.     Right lower leg: No edema.     Left lower leg: No edema.  Lymphadenopathy:     Cervical: No cervical adenopathy.  Skin:    General: Skin is warm and dry.     Capillary Refill: Capillary refill takes less than 2 seconds.     Coloration: Skin is not jaundiced or pale.     Findings: No bruising, erythema, lesion or rash.  Neurological:     General: No focal deficit present.     Mental Status: She is alert and oriented to person, place, and time. Mental status is at baseline.     Cranial Nerves: No cranial nerve deficit.     Sensory: No sensory deficit.     Motor: No weakness.  Coordination: Coordination normal.      Gait: Gait normal.     Deep Tendon Reflexes: Reflexes normal.  Psychiatric:        Mood and Affect: Mood normal.        Behavior: Behavior normal.        Thought Content: Thought content normal.        Judgment: Judgment normal.        04/07/2022    9:47 AM  6CIT Screen  What Year? 0 points  What month? 0 points  What time? 0 points  Count back from 20 0 points  Months in reverse 4 points  Repeat phrase 0 points  Total Score 4 points    Results for orders placed or performed in visit on 04/07/22  Microscopic Examination   Urine  Result Value Ref Range   WBC, UA 0-5 0 - 5 /hpf   RBC, Urine 0-2 0 - 2 /hpf   Epithelial Cells (non renal) 0-10 0 - 10 /hpf   Bacteria, UA None seen None seen/Few  Microalbumin, Urine Waived  Result Value Ref Range   Microalb, Ur Waived 10 0 - 19 mg/L   Creatinine, Urine Waived 10 10 - 300 mg/dL   Microalb/Creat Ratio <30 <30 mg/g  Urinalysis, Routine w reflex microscopic  Result Value Ref Range   Specific Gravity, UA 1.010 1.005 - 1.030   pH, UA 7.0 5.0 - 7.5   Color, UA Yellow Yellow   Appearance Ur Clear Clear   Leukocytes,UA Negative Negative   Protein,UA Negative Negative/Trace   Glucose, UA Negative Negative   Ketones, UA Negative Negative   RBC, UA Trace (A) Negative   Bilirubin, UA Negative Negative   Urobilinogen, Ur 0.2 0.2 - 1.0 mg/dL   Nitrite, UA Negative Negative   Microscopic Examination See below:       Assessment & Plan:   Problem List Items Addressed This Visit       Cardiovascular and Mediastinum   Hypertension    Under good control on current regimen. Continue current regimen. Continue to monitor. Call with any concerns. Refills given. Labs drawn today.        Relevant Medications   atorvastatin (LIPITOR) 20 MG tablet   hydrochlorothiazide (HYDRODIURIL) 25 MG tablet   Other Relevant Orders   Microalbumin, Urine Waived (Completed)   CBC with Differential/Platelet   Comprehensive metabolic panel    Urinalysis, Routine w reflex microscopic (Completed)   Senile purpura    Reassured patient. Continue to monitor.       Relevant Medications   atorvastatin (LIPITOR) 20 MG tablet   hydrochlorothiazide (HYDRODIURIL) 25 MG tablet     Endocrine   Hypothyroidism    Will recheck labs. Await results. Treat as needed.       Relevant Orders   CBC with Differential/Platelet   Comprehensive metabolic panel   TSH     Other   Hyperlipidemia    Under good control on current regimen. Continue current regimen. Continue to monitor. Call with any concerns. Refills given. Labs drawn today.       Relevant Medications   atorvastatin (LIPITOR) 20 MG tablet   hydrochlorothiazide (HYDRODIURIL) 25 MG tablet   Other Relevant Orders   CBC with Differential/Platelet   Comprehensive metabolic panel   Lipid Panel w/o Chol/HDL Ratio   Other Visit Diagnoses     Encounter for Medicare annual wellness exam    -  Primary   Preventative care discussed today as below.  Routine general medical examination at a health care facility       Vaccines up to date. Screening labs checked today. Mammo, DEXA and colonoscopy up to date. Continue diet and exercise. Call with any concerns.        Preventative Services:  Health Risk Assessment and Personalized Prevention Plan: Done today Bone Mass Measurements: Up to date Breast Cancer Screening: up to date CVD Screening: done today Cervical Cancer Screening: N/A Colon Cancer Screening: Up to date Depression Screening: Done today Diabetes Screening: Done Glaucoma Screening: See your eye doctor Hepatitis B vaccine: N/A Hepatitis C screening: up to date HIV Screening: up to date Flu Vaccine: up to date Lung cancer Screening: N/A Obesity Screening: Done today Pneumonia Vaccines (2): Up to date STI Screening: N/A  Follow up plan: Return in about 6 months (around 10/07/2022).   LABORATORY TESTING:  - Pap smear: not applicable  IMMUNIZATIONS:   - Tdap:  Tetanus vaccination status reviewed: last tetanus booster within 10 years. - Influenza: Up to date - Pneumovax: Up to date - Prevnar: Up to date - Zostavax vaccine: Up to date  SCREENING: -Mammogram: Up to date  - Colonoscopy: Up to date  - Bone Density: Up to date   PATIENT COUNSELING:   Advised to take 1 mg of folate supplement per day if capable of pregnancy.   Sexuality: Discussed sexually transmitted diseases, partner selection, use of condoms, avoidance of unintended pregnancy  and contraceptive alternatives.   Advised to avoid cigarette smoking.  I discussed with the patient that most people either abstain from alcohol or drink within safe limits (<=14/week and <=4 drinks/occasion for males, <=7/weeks and <= 3 drinks/occasion for females) and that the risk for alcohol disorders and other health effects rises proportionally with the number of drinks per week and how often a drinker exceeds daily limits.  Discussed cessation/primary prevention of drug use and availability of treatment for abuse.   Diet: Encouraged to adjust caloric intake to maintain  or achieve ideal body weight, to reduce intake of dietary saturated fat and total fat, to limit sodium intake by avoiding high sodium foods and not adding table salt, and to maintain adequate dietary potassium and calcium preferably from fresh fruits, vegetables, and low-fat dairy products.    stressed the importance of regular exercise  Injury prevention: Discussed safety belts, safety helmets, smoke detector, smoking near bedding or upholstery.   Dental health: Discussed importance of regular tooth brushing, flossing, and dental visits.    NEXT PREVENTATIVE PHYSICAL DUE IN 1 YEAR. Return in about 6 months (around 10/07/2022).

## 2022-04-07 NOTE — Patient Instructions (Signed)
Preventative Services:  Health Risk Assessment and Personalized Prevention Plan: Done today Bone Mass Measurements: Up to date Breast Cancer Screening: up to date CVD Screening: done today Cervical Cancer Screening: N/A Colon Cancer Screening: Up to date Depression Screening: Done today Diabetes Screening: Done Glaucoma Screening: See your eye doctor Hepatitis B vaccine: N/A Hepatitis C screening: up to date HIV Screening: up to date Flu Vaccine: up to date Lung cancer Screening: N/A Obesity Screening: Done today Pneumonia Vaccines (2): Up to date STI Screening: N/A

## 2022-04-07 NOTE — Addendum Note (Signed)
Addended by: Valerie Roys on: 04/07/2022 01:03 PM   Modules accepted: Level of Service

## 2022-04-08 ENCOUNTER — Other Ambulatory Visit: Payer: Self-pay | Admitting: Family Medicine

## 2022-04-08 LAB — CBC WITH DIFFERENTIAL/PLATELET
Basophils Absolute: 0.1 10*3/uL (ref 0.0–0.2)
Basos: 1 %
EOS (ABSOLUTE): 0.2 10*3/uL (ref 0.0–0.4)
Eos: 2 %
Hematocrit: 42 % (ref 34.0–46.6)
Hemoglobin: 14 g/dL (ref 11.1–15.9)
Immature Grans (Abs): 0 10*3/uL (ref 0.0–0.1)
Immature Granulocytes: 0 %
Lymphocytes Absolute: 2.7 10*3/uL (ref 0.7–3.1)
Lymphs: 35 %
MCH: 29.4 pg (ref 26.6–33.0)
MCHC: 33.3 g/dL (ref 31.5–35.7)
MCV: 88 fL (ref 79–97)
Monocytes Absolute: 0.6 10*3/uL (ref 0.1–0.9)
Monocytes: 8 %
Neutrophils Absolute: 4.2 10*3/uL (ref 1.4–7.0)
Neutrophils: 54 %
Platelets: 257 10*3/uL (ref 150–450)
RBC: 4.76 x10E6/uL (ref 3.77–5.28)
RDW: 13.2 % (ref 11.7–15.4)
WBC: 7.6 10*3/uL (ref 3.4–10.8)

## 2022-04-08 LAB — COMPREHENSIVE METABOLIC PANEL
ALT: 14 IU/L (ref 0–32)
AST: 22 IU/L (ref 0–40)
Albumin/Globulin Ratio: 1.7 (ref 1.2–2.2)
Albumin: 4.6 g/dL (ref 3.9–4.9)
Alkaline Phosphatase: 62 IU/L (ref 44–121)
BUN/Creatinine Ratio: 12 (ref 12–28)
BUN: 11 mg/dL (ref 8–27)
Bilirubin Total: 0.7 mg/dL (ref 0.0–1.2)
CO2: 27 mmol/L (ref 20–29)
Calcium: 9.8 mg/dL (ref 8.7–10.3)
Chloride: 101 mmol/L (ref 96–106)
Creatinine, Ser: 0.91 mg/dL (ref 0.57–1.00)
Globulin, Total: 2.7 g/dL (ref 1.5–4.5)
Glucose: 98 mg/dL (ref 70–99)
Potassium: 3.6 mmol/L (ref 3.5–5.2)
Sodium: 141 mmol/L (ref 134–144)
Total Protein: 7.3 g/dL (ref 6.0–8.5)
eGFR: 68 mL/min/{1.73_m2} (ref >59)

## 2022-04-08 LAB — TSH: TSH: 4.44 u[IU]/mL (ref 0.450–4.500)

## 2022-04-08 LAB — LIPID PANEL W/O CHOL/HDL RATIO
Cholesterol, Total: 152 mg/dL (ref 100–199)
HDL: 61 mg/dL (ref >39)
LDL Chol Calc (NIH): 69 mg/dL (ref 0–99)
Triglycerides: 127 mg/dL (ref 0–149)
VLDL Cholesterol Cal: 22 mg/dL (ref 5–40)

## 2022-04-08 MED ORDER — LEVOTHYROXINE SODIUM 75 MCG PO TABS: 75.0000 ug | ORAL_TABLET | Freq: Every day | ORAL | 3 refills | Status: DC

## 2022-09-23 DIAGNOSIS — L821 Other seborrheic keratosis: Secondary | ICD-10-CM | POA: Diagnosis not present

## 2022-09-23 DIAGNOSIS — Z872 Personal history of diseases of the skin and subcutaneous tissue: Secondary | ICD-10-CM | POA: Diagnosis not present

## 2022-09-23 DIAGNOSIS — Z85828 Personal history of other malignant neoplasm of skin: Secondary | ICD-10-CM | POA: Diagnosis not present

## 2022-09-23 DIAGNOSIS — L578 Other skin changes due to chronic exposure to nonionizing radiation: Secondary | ICD-10-CM | POA: Diagnosis not present

## 2022-09-23 DIAGNOSIS — B372 Candidiasis of skin and nail: Secondary | ICD-10-CM | POA: Diagnosis not present

## 2022-09-28 DIAGNOSIS — H40003 Preglaucoma, unspecified, bilateral: Secondary | ICD-10-CM | POA: Diagnosis not present

## 2022-10-05 DIAGNOSIS — H40003 Preglaucoma, unspecified, bilateral: Secondary | ICD-10-CM | POA: Diagnosis not present

## 2022-10-05 DIAGNOSIS — Z961 Presence of intraocular lens: Secondary | ICD-10-CM | POA: Diagnosis not present

## 2022-10-07 ENCOUNTER — Ambulatory Visit (INDEPENDENT_AMBULATORY_CARE_PROVIDER_SITE_OTHER): Payer: Medicare Other | Admitting: Family Medicine

## 2022-10-07 ENCOUNTER — Encounter: Payer: Self-pay | Admitting: Family Medicine

## 2022-10-07 VITALS — BP 138/75 | HR 90 | Ht 62.0 in | Wt 156.2 lb

## 2022-10-07 DIAGNOSIS — I10 Essential (primary) hypertension: Secondary | ICD-10-CM

## 2022-10-07 DIAGNOSIS — Z23 Encounter for immunization: Secondary | ICD-10-CM

## 2022-10-07 DIAGNOSIS — R531 Weakness: Secondary | ICD-10-CM | POA: Diagnosis not present

## 2022-10-07 DIAGNOSIS — R1013 Epigastric pain: Secondary | ICD-10-CM | POA: Diagnosis not present

## 2022-10-07 DIAGNOSIS — E039 Hypothyroidism, unspecified: Secondary | ICD-10-CM

## 2022-10-07 DIAGNOSIS — E782 Mixed hyperlipidemia: Secondary | ICD-10-CM | POA: Diagnosis not present

## 2022-10-07 MED ORDER — HYDROCHLOROTHIAZIDE 25 MG PO TABS: 25.0000 mg | ORAL_TABLET | Freq: Every day | ORAL | 1 refills | Status: DC

## 2022-10-07 MED ORDER — ATORVASTATIN CALCIUM 20 MG PO TABS: 20.0000 mg | ORAL_TABLET | Freq: Every day | ORAL | 1 refills | Status: DC

## 2022-10-07 MED ORDER — TRAZODONE HCL 100 MG PO TABS: 100.0000 mg | ORAL_TABLET | Freq: Every day | ORAL | 1 refills | Status: DC

## 2022-10-07 MED ORDER — POTASSIUM CHLORIDE CRYS ER 10 MEQ PO TBCR: 10.0000 meq | EXTENDED_RELEASE_TABLET | Freq: Every day | ORAL | 1 refills | Status: DC

## 2022-10-07 NOTE — Assessment & Plan Note (Signed)
Rechecking labs today. Await results. Treat as needed.  °

## 2022-10-07 NOTE — Assessment & Plan Note (Signed)
Under good control on current regimen. Continue current regimen. Continue to monitor. Call with any concerns. Refills given. Labs drawn today.   

## 2022-10-07 NOTE — Progress Notes (Signed)
BP 138/75   Pulse 90   Ht 5\' 2"  (1.575 m)   Wt 156 lb 3.2 oz (70.9 kg)   SpO2 94%   BMI 28.57 kg/m    Subjective:    Patient ID: Alexandria Craig, female    DOB: 1950-06-10, 72 y.o.   MRN: [id number]  HPI: Alexandria Craig is a 72 y.o. female  Chief Complaint  Patient presents with   Hypothyroidism   Hyperlipidemia   Hypertension   Weakness    Patient says she has noticed some spells that will make her arms and legs weak. Patient says she will starting having a "funny feeling" that starts in her abdomen area and then she will feel weakness in her arms and legs. Patient says her last episode happen last week while in the grocery. Patient says she has been having noticing headaches and having some floaters.    CHEST PAIN- has had weird episodes where she gets a funny feeling with pressure in her epigastric area then her arms become very weak and tired. It lasts several minutes and happens about 1x a month.  Duration: couple of months Onset: sudden Quality: feels "weird" or pressure in her epigastric area Severity: moderate Location: epigastric Radiation: both arms Episode duration: minutes Frequency: 1x a month Related to exertion: yes Activity when pain started: walking around [employer] Trauma: no Anxiety/recent stressors: no Status: stable Treatments attempted: nothing  Current pain status: pain free Shortness of breath: no Cough: no Nausea: no Diaphoresis: no Heartburn: no Palpitations: no  HYPERTENSION / HYPERLIPIDEMIA Satisfied with current treatment? yes Duration of hypertension: chronic BP monitoring frequency: not checking BP medication side effects: no Past BP meds: HCTZ Duration of hyperlipidemia: chronic Cholesterol medication side effects: no Cholesterol supplements: none Past cholesterol medications: atorvastatin Medication compliance: excellent compliance Aspirin: no Recent stressors: no Recurrent headaches: no Visual changes:  no Palpitations: no Dyspnea: no Chest pain: no Lower extremity edema: no Dizzy/lightheaded: no  HYPOTHYROIDISM Thyroid control status:controlled Satisfied with current treatment? yes Medication side effects: no Medication compliance: excellent compliance Recent dose adjustment:no Fatigue: no Cold intolerance: no Heat intolerance: no Weight gain: no Weight loss: no Constipation: no Diarrhea/loose stools: no Palpitations: no Lower extremity edema: no Anxiety/depressed mood: no   Relevant past medical, surgical, family and social history reviewed and updated as indicated. Interim medical history since our last visit reviewed. Allergies and medications reviewed and updated.  Review of Systems  Constitutional: Negative.   Respiratory: Negative.    Cardiovascular:  Positive for chest pain. Negative for palpitations and leg swelling.  Gastrointestinal:  Positive for abdominal pain. Negative for abdominal distention, anal bleeding, blood in stool, constipation, diarrhea, nausea, rectal pain and vomiting.  Musculoskeletal: Negative.   Neurological:  Positive for weakness. Negative for dizziness, tremors, seizures, syncope, facial asymmetry, speech difficulty, light-headedness, numbness and headaches.  Psychiatric/Behavioral: Negative.      Per HPI unless specifically indicated above     Objective:    BP 138/75   Pulse 90   Ht 5\' 2"  (1.575 m)   Wt 156 lb 3.2 oz (70.9 kg)   SpO2 94%   BMI 28.57 kg/m   Wt Readings from Last 3 Encounters:  10/07/22 156 lb 3.2 oz (70.9 kg)  04/07/22 156 lb 8 oz (71 kg)  11/06/21 153 lb (69.4 kg)    Physical Exam Vitals and nursing note reviewed.  Constitutional:      General: She is not in acute distress.    Appearance: Normal appearance. She  is normal weight. She is not ill-appearing, toxic-appearing or diaphoretic.  HENT:     Head: Normocephalic and atraumatic.     Right Ear: External ear normal.     Left Ear: External ear normal.      Nose: Nose normal.     Mouth/Throat:     Mouth: Mucous membranes are moist.     Pharynx: Oropharynx is clear.  Eyes:     General: No scleral icterus.       Right eye: No discharge.        Left eye: No discharge.     Extraocular Movements: Extraocular movements intact.     Conjunctiva/sclera: Conjunctivae normal.     Pupils: Pupils are equal, round, and reactive to light.  Cardiovascular:     Rate and Rhythm: Normal rate and regular rhythm.     Pulses: Normal pulses.     Heart sounds: Normal heart sounds. No murmur heard.    No friction rub. No gallop.  Pulmonary:     Effort: Pulmonary effort is normal. No respiratory distress.     Breath sounds: Normal breath sounds. No stridor. No wheezing, rhonchi or rales.  Chest:     Chest wall: No tenderness.  Musculoskeletal:        General: Normal range of motion.     Cervical back: Normal range of motion and neck supple.  Skin:    General: Skin is warm and dry.     Capillary Refill: Capillary refill takes less than 2 seconds.     Coloration: Skin is not jaundiced or pale.     Findings: No bruising, erythema, lesion or rash.  Neurological:     General: No focal deficit present.     Mental Status: She is alert and oriented to person, place, and time. Mental status is at baseline.  Psychiatric:        Mood and Affect: Mood normal.        Behavior: Behavior normal.        Thought Content: Thought content normal.        Judgment: Judgment normal.     Results for orders placed or performed in visit on 04/07/22  Microscopic Examination   Urine  Result Value Ref Range   WBC, UA 0-5 0 - 5 /hpf   RBC, Urine 0-2 0 - 2 /hpf   Epithelial Cells (non renal) 0-10 0 - 10 /hpf   Bacteria, UA None seen None seen/Few  Microalbumin, Urine Waived  Result Value Ref Range   Microalb, Ur Waived 10 0 - 19 mg/L   Creatinine, Urine Waived 10 10 - 300 mg/dL   Microalb/Creat Ratio <30 <30 mg/g  CBC with Differential/Platelet  Result Value Ref  Range   WBC 7.6 3.4 - 10.8 x10E3/uL   RBC 4.76 3.77 - 5.28 x10E6/uL   Hemoglobin 14.0 11.1 - 15.9 g/dL   Hematocrit 47.8 29.5 - 46.6 %   MCV 88 79 - 97 fL   MCH 29.4 26.6 - 33.0 pg   MCHC 33.3 31.5 - 35.7 g/dL   RDW 62.1 30.8 - 65.7 %   Platelets 257 150 - 450 x10E3/uL   Neutrophils 54 Not Estab. %   Lymphs 35 Not Estab. %   Monocytes 8 Not Estab. %   Eos 2 Not Estab. %   Basos 1 Not Estab. %   Neutrophils Absolute 4.2 1.4 - 7.0 x10E3/uL   Lymphocytes Absolute 2.7 0.7 - 3.1 x10E3/uL   Monocytes Absolute 0.6 0.1 -  0.9 x10E3/uL   EOS (ABSOLUTE) 0.2 0.0 - 0.4 x10E3/uL   Basophils Absolute 0.1 0.0 - 0.2 x10E3/uL   Immature Granulocytes 0 Not Estab. %   Immature Grans (Abs) 0.0 0.0 - 0.1 x10E3/uL  Comprehensive metabolic panel  Result Value Ref Range   Glucose 98 70 - 99 mg/dL   BUN 11 8 - 27 mg/dL   Creatinine, Ser 1.61 0.57 - 1.00 mg/dL   eGFR 68 >09 UE/AVW/0.98   BUN/Creatinine Ratio 12 12 - 28   Sodium 141 134 - 144 mmol/L   Potassium 3.6 3.5 - 5.2 mmol/L   Chloride 101 96 - 106 mmol/L   CO2 27 20 - 29 mmol/L   Calcium 9.8 8.7 - 10.3 mg/dL   Total Protein 7.3 6.0 - 8.5 g/dL   Albumin 4.6 3.9 - 4.9 g/dL   Globulin, Total 2.7 1.5 - 4.5 g/dL   Albumin/Globulin Ratio 1.7 1.2 - 2.2   Bilirubin Total 0.7 0.0 - 1.2 mg/dL   Alkaline Phosphatase 62 44 - 121 IU/L   AST 22 0 - 40 IU/L   ALT 14 0 - 32 IU/L  Lipid Panel w/o Chol/HDL Ratio  Result Value Ref Range   Cholesterol, Total 152 100 - 199 mg/dL   Triglycerides 119 0 - 149 mg/dL   HDL 61 >14 mg/dL   VLDL Cholesterol Cal 22 5 - 40 mg/dL   LDL Chol Calc (NIH) 69 0 - 99 mg/dL  Urinalysis, Routine w reflex microscopic  Result Value Ref Range   Specific Gravity, UA 1.010 1.005 - 1.030   pH, UA 7.0 5.0 - 7.5   Color, UA Yellow Yellow   Appearance Ur Clear Clear   Leukocytes,UA Negative Negative   Protein,UA Negative Negative/Trace   Glucose, UA Negative Negative   Ketones, UA Negative Negative   RBC, UA Trace (A)  Negative   Bilirubin, UA Negative Negative   Urobilinogen, Ur 0.2 0.2 - 1.0 mg/dL   Nitrite, UA Negative Negative   Microscopic Examination See below:   TSH  Result Value Ref Range   TSH 4.440 0.450 - 4.500 uIU/mL      Assessment & Plan:   Problem List Items Addressed This Visit       Cardiovascular and Mediastinum   Hypertension    Under good control on current regimen. Continue current regimen. Continue to monitor. Call with any concerns. Refills given. Labs drawn today.       Relevant Medications   atorvastatin (LIPITOR) 20 MG tablet   hydrochlorothiazide (HYDRODIURIL) 25 MG tablet   Other Relevant Orders   CBC with Differential/Platelet   Comprehensive metabolic panel     Endocrine   Hypothyroidism    Rechecking labs today. Await results. Treat as needed.       Relevant Orders   CBC with Differential/Platelet   Comprehensive metabolic panel   TSH     Other   Hyperlipidemia - Primary    Under good control on current regimen. Continue current regimen. Continue to monitor. Call with any concerns. Refills given. Labs drawn today.        Relevant Medications   atorvastatin (LIPITOR) 20 MG tablet   hydrochlorothiazide (HYDRODIURIL) 25 MG tablet   Other Relevant Orders   CBC with Differential/Platelet   Lipid Panel w/o Chol/HDL Ratio   Comprehensive metabolic panel   Other Visit Diagnoses     Epigastric abdominal pain       EKG normal, but concern for anginal equivalent. Will refer to cardiology. Labs  drawn today. Await results.   Relevant Orders   EKG 12-Lead (Completed)   Ambulatory referral to Cardiology   Weakness       EKG normal, but concern for anginal equivalent. Will refer to cardiology. Labs drawn today. Await results.   Relevant Orders   Ambulatory referral to Cardiology   Needs flu shot       Flu shot given today.   Relevant Orders   Flu Vaccine Trivalent High Dose (Fluad) (Completed)        Follow up plan: No follow-ups on  file.

## 2022-10-08 DIAGNOSIS — L821 Other seborrheic keratosis: Secondary | ICD-10-CM | POA: Diagnosis not present

## 2022-10-08 LAB — COMPREHENSIVE METABOLIC PANEL
ALT: 14 [IU]/L (ref 0–32)
AST: 20 [IU]/L (ref 0–40)
Albumin: 4.3 g/dL (ref 3.9–4.9)
Alkaline Phosphatase: 63 [IU]/L (ref 44–121)
BUN/Creatinine Ratio: 19 (ref 12–28)
BUN: 18 mg/dL (ref 8–27)
Bilirubin Total: 0.6 mg/dL (ref 0.0–1.2)
CO2: 27 mmol/L (ref 20–29)
Calcium: 9.8 mg/dL (ref 8.7–10.3)
Chloride: 100 mmol/L (ref 96–106)
Creatinine, Ser: 0.95 mg/dL (ref 0.57–1.00)
Globulin, Total: 2.5 g/dL (ref 1.5–4.5)
Glucose: 98 mg/dL (ref 70–99)
Potassium: 3.7 mmol/L (ref 3.5–5.2)
Sodium: 142 mmol/L (ref 134–144)
Total Protein: 6.8 g/dL (ref 6.0–8.5)
eGFR: 64 mL/min/{1.73_m2} (ref >59)

## 2022-10-08 LAB — CBC WITH DIFFERENTIAL/PLATELET
Basophils Absolute: 0.1 10*3/uL (ref 0.0–0.2)
Basos: 1 %
EOS (ABSOLUTE): 0.2 10*3/uL (ref 0.0–0.4)
Eos: 2 %
Hematocrit: 42.1 % (ref 34.0–46.6)
Hemoglobin: 13.8 g/dL (ref 11.1–15.9)
Immature Grans (Abs): 0 10*3/uL (ref 0.0–0.1)
Immature Granulocytes: 0 %
Lymphocytes Absolute: 2.2 10*3/uL (ref 0.7–3.1)
Lymphs: 31 %
MCH: 30.1 pg (ref 26.6–33.0)
MCHC: 32.8 g/dL (ref 31.5–35.7)
MCV: 92 fL (ref 79–97)
Monocytes Absolute: 0.6 10*3/uL (ref 0.1–0.9)
Monocytes: 9 %
Neutrophils Absolute: 4.1 10*3/uL (ref 1.4–7.0)
Neutrophils: 57 %
Platelets: 225 10*3/uL (ref 150–450)
RBC: 4.58 x10E6/uL (ref 3.77–5.28)
RDW: 13.1 % (ref 11.7–15.4)
WBC: 7.1 10*3/uL (ref 3.4–10.8)

## 2022-10-08 LAB — TSH: TSH: 3.37 u[IU]/mL (ref 0.450–4.500)

## 2022-10-08 LAB — LIPID PANEL W/O CHOL/HDL RATIO
Cholesterol, Total: 154 mg/dL (ref 100–199)
HDL: 60 mg/dL (ref >39)
LDL Chol Calc (NIH): 76 mg/dL (ref 0–99)
Triglycerides: 97 mg/dL (ref 0–149)
VLDL Cholesterol Cal: 18 mg/dL (ref 5–40)

## 2022-10-14 ENCOUNTER — Other Ambulatory Visit: Payer: Self-pay | Admitting: Family Medicine

## 2022-10-14 DIAGNOSIS — L821 Other seborrheic keratosis: Secondary | ICD-10-CM | POA: Diagnosis not present

## 2022-10-14 MED ORDER — LEVOTHYROXINE SODIUM 75 MCG PO TABS: 75.0000 ug | ORAL_TABLET | Freq: Every day | ORAL | 1 refills | Status: DC

## 2022-10-18 NOTE — Progress Notes (Signed)
Interpreted by me 10/07/22. NSR at 71bpm. No ST segment changes.

## 2022-11-26 ENCOUNTER — Ambulatory Visit: Payer: Medicare Other | Attending: Cardiology | Admitting: Cardiology

## 2022-11-26 ENCOUNTER — Encounter: Payer: Self-pay | Admitting: Cardiology

## 2022-11-26 VITALS — BP 148/94 | HR 84 | Ht 62.5 in | Wt 156.6 lb

## 2022-11-26 DIAGNOSIS — E7800 Pure hypercholesterolemia, unspecified: Secondary | ICD-10-CM | POA: Diagnosis not present

## 2022-11-26 DIAGNOSIS — R42 Dizziness and giddiness: Secondary | ICD-10-CM | POA: Diagnosis not present

## 2022-11-26 DIAGNOSIS — I10 Essential (primary) hypertension: Secondary | ICD-10-CM | POA: Diagnosis not present

## 2022-11-26 DIAGNOSIS — R0609 Other forms of dyspnea: Secondary | ICD-10-CM | POA: Diagnosis not present

## 2022-11-26 DIAGNOSIS — I2089 Other forms of angina pectoris: Secondary | ICD-10-CM | POA: Diagnosis not present

## 2022-11-26 MED ORDER — METOPROLOL TARTRATE 100 MG PO TABS: 100.0000 mg | ORAL_TABLET | Freq: Once | ORAL | 0 refills | Status: DC

## 2022-11-26 NOTE — Patient Instructions (Signed)
 Medication Instructions:   Your physician recommends that you continue on your current medications as directed. Please refer to the Current Medication list given to you today.  *If you need a refill on your cardiac medications before your next appointment, please call your pharmacy*   Lab Work:  Your physician recommends you have labs - BMP   If you have labs (blood work) drawn today and your tests are completely normal, you will receive your results only by: MyChart Message (if you have MyChart) OR A paper copy in the mail If you have any lab test that is abnormal or we need to change your treatment, we will call you to review the results.   Testing/Procedures:  Your physician has requested that you have an echocardiogram. Echocardiography is a painless test that uses sound waves to create images of your heart. It provides your doctor with information about the size and shape of your heart and how well your heart's chambers and valves are working. This procedure takes approximately one hour. There are no restrictions for this procedure. Please do NOT wear cologne, perfume, aftershave, or lotions (deodorant is allowed). Please arrive 15 minutes prior to your appointment time.  Please note: We ask at that you not bring children with you during ultrasound (echo/ vascular) testing. Due to room size and safety concerns, children are not allowed in the ultrasound rooms during exams. Our front office staff cannot provide observation of children in our lobby area while testing is being conducted. An adult accompanying a patient to their appointment will only be allowed in the ultrasound room at the discretion of the ultrasound technician under special circumstances. We apologize for any inconvenience.    Your cardiac CT will be scheduled at one of the below locations:   [facility] [address] [place], [place] [address] [phone]  OR    [facility] [address] [place], [place] [address] [phone]  If scheduled at [facility] or [facility], please arrive 15 mins early for check-in and test prep.  There is spacious parking and easy access to the radiology department from the [facility] entrance. Please enter here and check-in with the desk attendant.   Please follow these instructions carefully (unless otherwise directed):  An IV will be required for this test and Nitroglycerin will be given.  Hold all erectile dysfunction medications at least 3 days (72 hrs) prior to test. (Ie viagra, cialis, sildenafil, tadalafil, etc)   On the Night Before the Test: Be sure to Drink plenty of water. Do not consume any caffeinated/decaffeinated beverages or chocolate 12 hours prior to your test. Do not take any antihistamines 12 hours prior to your test.  On the Day of the Test: Drink plenty of water until 1 hour prior to the test. Do not eat any food 1 hour prior to test. You may take your regular medications prior to the test.  Take metoprolol (Lopressor) two hours prior to test. If you take Furosemide/Hydrochlorothiazide/Spironolactone, please HOLD on the morning of the test. FEMALES- please wear underwire-free bra if available, avoid dresses & tight clothing      After the Test: Drink plenty of water. After receiving IV contrast, you may experience a mild flushed feeling. This is normal. On occasion, you may experience a mild rash up to 24 hours after the test. This is not dangerous. If this occurs, you can take Benadryl 25 mg and  increase your fluid intake. If you experience trouble breathing, this can be serious. If it is severe call 911 IMMEDIATELY. If it is mild, please call our office. If you take any of these medications: Glipizide/Metformin, Avandament, Glucavance, please do not take 48 hours after completing test unless  otherwise instructed.  We will call to schedule your test 2-4 weeks out understanding that some insurance companies will need an authorization prior to the service being performed.   For more information and frequently asked questions, please visit our website : [email or web address]  For non-scheduling related questions, please contact the cardiac imaging nurse navigator should you have any questions/concerns: Cardiac Imaging Nurse Navigators Direct Office Dial: [phone]   For scheduling needs, including cancellations and rescheduling, please call [place], [phone].   Follow-Up: At [facility], you and your health needs are our priority.  As part of our continuing mission to provide you with exceptional heart care, we have created designated Provider Care Teams.  These Care Teams include your primary Cardiologist (physician) and Advanced Practice Providers (APPs -  Physician Assistants and Nurse Practitioners) who all work together to provide you with the care you need, when you need it.  We recommend signing up for the patient portal called "MyChart".  Sign up information is provided on this After Visit Summary.  MyChart is used to connect with patients for Virtual Visits (Telemedicine).  Patients are able to view lab/test results, encounter notes, upcoming appointments, etc.  Non-urgent messages can be sent to your provider as well.   To learn more about what you can do with MyChart, go to [email or web address].    Your next appointment:    8 - 10 weeks  Provider:   You may see Debbe Odea, MD or one of the following Advanced Practice Providers on your designated Care Team:   Nicolasa Ducking, NP Eula Listen, PA-C Cadence Fransico Michael, PA-C Charlsie Quest, NP Carlos Levering, NP

## 2022-11-26 NOTE — Progress Notes (Signed)
Cardiology Office Note:    Date:  11/26/2022   ID:  Alexandria Craig, DOB 1950/11/24, MRN [id number]  PCP:  Dorcas Carrow, DO   [facility] Providers Cardiologist:  Debbe Odea, MD     Referring MD: Dorcas Carrow, DO   Chief Complaint  Patient presents with   New Patient (Initial Visit)    Referred for episodes of feeling weakness in arms and legs with last episode 4 days ago.      History of Present Illness:    Alexandria Craig is a 72 y.o. female with a hx of hypertension, hyperlipidemia, hypothyroidism presenting with shortness of breath and dizziness.    Occasionally gets dizzy sometimes standing for a few seconds.  Denies passing out.  Denies palpitations.  Also endorses shortness of breath when she overexerts herself, has attributed symptoms to age.  Brother had a heart attack in his 28s, mother also had a heart attack in her 67s.  Both brother and mother were smokers.  Last episode of dizziness occurred while standing at a restaurant, prior occurrence was several months ago while taking a warm shower.  Past Medical History:  Diagnosis Date   Hypertension    Hypothyroidism    Skin cancer    followed by dermatology   Thyroid disease     Past Surgical History:  Procedure Laterality Date   CATARACT EXTRACTION W/PHACO Right 06/14/2019   Procedure: CATARACT EXTRACTION PHACO AND INTRAOCULAR LENS PLACEMENT (IOC) RIGHT;  Surgeon: Lockie Mola, MD;  Location: [facility];  Service: Ophthalmology;  Laterality: Right;  8.13, 0:50, 16.1%   CATARACT EXTRACTION W/PHACO Left 07/12/2019   Procedure: CATARACT EXTRACTION PHACO AND INTRAOCULAR LENS PLACEMENT (IOC) LEFT 5.49 00:51.8 10.6%;  Surgeon: Lockie Mola, MD;  Location: [facility];  Service: Ophthalmology;  Laterality: Left;   CESAREAN SECTION     COLON SURGERY     COLONOSCOPY     COLONOSCOPY WITH PROPOFOL N/A 11/06/2021   Procedure: COLONOSCOPY WITH PROPOFOL;  Surgeon:  [name], MD;  Location: [facility];  Service: Gastroenterology;  Laterality: N/A;   EYE SURGERY      Current Medications: Current Meds  Medication Sig   atorvastatin (LIPITOR) 20 MG tablet Take 1 tablet (20 mg total) by mouth daily.   Calcium Carbonate (CALCIUM 600 PO) Take by mouth daily.   hydrochlorothiazide (HYDRODIURIL) 25 MG tablet Take 1 tablet (25 mg total) by mouth daily.   hydrocortisone 2.5 % cream Apply topically.   levothyroxine (SYNTHROID) 75 MCG tablet Take 1 tablet (75 mcg total) by mouth daily before breakfast.   metoprolol tartrate (LOPRESSOR) 100 MG tablet Take 1 tablet (100 mg total) by mouth once for 1 dose. TWO HOURS PRIOR TO CARDIAC CT   Multiple Vitamin (MULTIVITAMIN) tablet Take 1 tablet by mouth daily.   Multiple Vitamins-Minerals (MULTIVITAMIN WITH MINERALS) tablet Take 1 tablet by mouth daily.   polyethylene glycol powder (CLEARLAX) 17 GM/SCOOP powder Take 1 Container by mouth once.   potassium chloride (KLOR-CON M) 10 MEQ tablet Take 1 tablet (10 mEq total) by mouth daily.   traZODone (DESYREL) 100 MG tablet Take 1-1.5 tablets (100-150 mg total) by mouth at bedtime.     Allergies:   Patient has no known allergies.   Social History   Socioeconomic History   Marital status: Married    Spouse name: Not on file   Number of children: Not on file   Years of education: Not on file   Highest education level:  Not on file  Occupational History   Not on file  Tobacco Use   Smoking status: Never   Smokeless tobacco: Never  Vaping Use   Vaping status: Never Used  Substance and Sexual Activity   Alcohol use: No   Drug use: No   Sexual activity: Not on file  Other Topics Concern   Not on file  Social History Narrative   Not on file   Social Determinants of Health   Financial Resource Strain: Low Risk  (11/04/2020)   Received from [facility], [facility]   Overall Financial Resource Strain (CARDIA)    Difficulty of Paying Living  Expenses: Not hard at all  Food Insecurity: No Food Insecurity (11/04/2020)   Received from [facility], [facility]   Hunger Vital Sign    Worried About Running Out of Food in the Last Year: Never true    Ran Out of Food in the Last Year: Never true  Transportation Needs: No Transportation Needs (11/04/2020)   Received from [facility], [facility]   [facility] - Transportation    Lack of Transportation (Medical): No    Lack of Transportation (Non-Medical): No  Physical Activity: Not on file  Stress: Not on file  Social Connections: Not on file     Family History: The patient's family history includes Breast cancer (age of onset: 63) in her mother; Heart disease in her mother.  ROS:   Please see the history of present illness.     All other systems reviewed and are negative.  EKGs/Labs/Other Studies Reviewed:    The following studies were reviewed today:  EKG Interpretation Date/Time:  Thursday November 26 2022 11:28:55 EST Ventricular Rate:  84 PR Interval:  176 QRS Duration:  82 QT Interval:  390 QTC Calculation: 460 R Axis:   -5  Text Interpretation: Normal sinus rhythm Normal ECG Confirmed by Debbe Odea ([id number]) on 11/26/2022 11:36:24 AM    Recent Labs: 10/07/2022: ALT 14; BUN 18; Creatinine, Ser 0.95; Hemoglobin 13.8; Platelets 225; Potassium 3.7; Sodium 142; TSH 3.370  Recent Lipid Panel    Component Value Date/Time   CHOL 154 10/07/2022 0953   TRIG 97 10/07/2022 0953   HDL 60 10/07/2022 0953   LDLCALC 76 10/07/2022 0953     Risk Assessment/Calculations:     HYPERTENSION CONTROL Vitals:   11/26/22 1122 11/26/22 1130  BP: (!) 142/94 (!) 148/94    The patient's blood pressure is elevated above target today.  In order to address the patient's elevated BP: Blood pressure will be monitored at home to determine if medication changes need to be made.            Physical Exam:    VS:  BP (!) 148/94 (BP Location: Right Arm, Patient  Position: Sitting, Cuff Size: Large)   Pulse 84   Ht 5' 2.5" (1.588 m)   Wt 156 lb 9.6 oz (71 kg)   SpO2 96%   BMI 28.19 kg/m     Wt Readings from Last 3 Encounters:  11/26/22 156 lb 9.6 oz (71 kg)  10/07/22 156 lb 3.2 oz (70.9 kg)  04/07/22 156 lb 8 oz (71 kg)     GEN:  Well nourished, well developed in no acute distress HEENT: Normal NECK: No JVD; No carotid bruits CARDIAC: RRR, no murmurs, rubs, gallops RESPIRATORY:  Clear to auscultation without rales, wheezing or rhonchi  ABDOMEN: Soft, non-tender, non-distended MUSCULOSKELETAL:  No edema; No deformity  SKIN:  Warm and dry NEUROLOGIC:  Alert and oriented x 3 PSYCHIATRIC:  Normal affect   ASSESSMENT:    1. Dyspnea on exertion   2. Primary hypertension   3. Pure hypercholesterolemia   4. Anginal equivalent (HCC)   5. Dizziness    PLAN:    In order of problems listed above:  Dyspnea on exertion, angina equivalent.  Risk factors include family history, hypertension, hyperlipidemia.  Obtain echo, obtain coronary CTA. Hypertension, BP slightly elevated, usually controlled.  Continue HCTZ 25 mg daily. Hyperlipidemia, cholesterol controlled.  Continue Lipitor 20 mg daily. Occasional dizziness, usually occurring while standing or taking warm shower.  Appears vasovagal, monitor for now.  Follow-up after echo and CT      Medication Adjustments/Labs and Tests Ordered: Current medicines are reviewed at length with the patient today.  Concerns regarding medicines are outlined above.  Orders Placed This Encounter  Procedures   CT CORONARY MORPH W/CTA COR W/SCORE W/CA W/CM &/OR WO/CM   Basic Metabolic Panel (BMET)   EKG 12-Lead   ECHOCARDIOGRAM COMPLETE   Meds ordered this encounter  Medications   metoprolol tartrate (LOPRESSOR) 100 MG tablet    Sig: Take 1 tablet (100 mg total) by mouth once for 1 dose. TWO HOURS PRIOR TO CARDIAC CT    Dispense:  1 tablet    Refill:  0    Patient Instructions  Medication  Instructions:   Your physician recommends that you continue on your current medications as directed. Please refer to the Current Medication list given to you today.  *If you need a refill on your cardiac medications before your next appointment, please call your pharmacy*   Lab Work:  Your physician recommends you have labs - BMP   If you have labs (blood work) drawn today and your tests are completely normal, you will receive your results only by: MyChart Message (if you have MyChart) OR A paper copy in the mail If you have any lab test that is abnormal or we need to change your treatment, we will call you to review the results.   Testing/Procedures:  Your physician has requested that you have an echocardiogram. Echocardiography is a painless test that uses sound waves to create images of your heart. It provides your doctor with information about the size and shape of your heart and how well your heart's chambers and valves are working. This procedure takes approximately one hour. There are no restrictions for this procedure. Please do NOT wear cologne, perfume, aftershave, or lotions (deodorant is allowed). Please arrive 15 minutes prior to your appointment time.  Please note: We ask at that you not bring children with you during ultrasound (echo/ vascular) testing. Due to room size and safety concerns, children are not allowed in the ultrasound rooms during exams. Our front office staff cannot provide observation of children in our lobby area while testing is being conducted. An adult accompanying a patient to their appointment will only be allowed in the ultrasound room at the discretion of the ultrasound technician under special circumstances. We apologize for any inconvenience.    Your cardiac CT will be scheduled at one of the below locations:   [facility] [address] [place], [place] [address] [phone]  OR   [facility] [address] [place], [place] [address] [phone]  If scheduled at [facility] or [facility], please arrive 15 mins early for check-in and test prep.  There is spacious parking and easy access to the radiology department from the [facility] entrance. Please enter here and check-in with the desk attendant.   Please follow these instructions carefully (unless otherwise directed):  An IV will be required for this test and Nitroglycerin will be given.  Hold all erectile dysfunction medications at least 3 days (72 hrs) prior to test. (Ie viagra, cialis, sildenafil, tadalafil, etc)   On the Night Before the Test: Be sure to Drink plenty of water. Do not consume any caffeinated/decaffeinated beverages or chocolate 12 hours prior to your test. Do not take any antihistamines 12 hours prior to your test.  On the Day of the Test: Drink plenty of water until 1 hour prior to the test. Do not eat any food 1 hour prior to test. You may take your regular medications prior to the test.  Take metoprolol (Lopressor) two hours prior to test. If you take Furosemide/Hydrochlorothiazide/Spironolactone, please HOLD on the morning of the test. FEMALES- please wear underwire-free bra if available, avoid dresses & tight clothing  After the Test: Drink plenty of water. After receiving IV contrast, you may experience a mild flushed feeling. This is normal. On occasion, you may experience a mild rash up to 24 hours after the test. This is not dangerous. If this occurs, you can take Benadryl 25 mg and increase your fluid intake. If you experience trouble breathing, this can be serious. If it is severe call 911 IMMEDIATELY. If it is mild, please call our office. If you take any of these medications: Glipizide/Metformin, Avandament, Glucavance, please do not take 48 hours after completing test unless otherwise  instructed.  We will call to schedule your test 2-4 weeks out understanding that some insurance companies will need an authorization prior to the service being performed.   For more information and frequently asked questions, please visit our website : [email or web address]  For non-scheduling related questions, please contact the cardiac imaging nurse navigator should you have any questions/concerns: Cardiac Imaging Nurse Navigators Direct Office Dial: [phone]   For scheduling needs, including cancellations and rescheduling, please call [place], [phone].   Follow-Up: At [facility], you and your health needs are our priority.  As part of our continuing mission to provide you with exceptional heart care, we have created designated Provider Care Teams.  These Care Teams include your primary Cardiologist (physician) and Advanced Practice Providers (APPs -  Physician Assistants and Nurse Practitioners) who all work together to provide you with the care you need, when you need it.  We recommend signing up for the patient portal called "MyChart".  Sign up information is provided on this After Visit Summary.  MyChart is used to connect with patients for Virtual Visits (Telemedicine).  Patients are able to view lab/test results, encounter notes, upcoming appointments, etc.  Non-urgent messages can be sent to your provider as well.   To learn more about what you can do with MyChart, go to [email or web address].    Your next appointment:   8 - 10 week(s)  Provider:   You may see Debbe Odea, MD or one of the following Advanced Practice Providers on your designated Care Team:   Nicolasa Ducking, NP Eula Listen, PA-C Cadence Fransico Michael, PA-C Charlsie Quest, NP Carlos Levering, NP   Signed, Debbe Odea, MD  11/26/2022 12:26 PM    [facility]

## 2022-11-27 LAB — BASIC METABOLIC PANEL
BUN/Creatinine Ratio: 12 (ref 12–28)
BUN: 11 mg/dL (ref 8–27)
CO2: 28 mmol/L (ref 20–29)
Calcium: 10.4 mg/dL — ABNORMAL HIGH (ref 8.7–10.3)
Chloride: 100 mmol/L (ref 96–106)
Creatinine, Ser: 0.91 mg/dL (ref 0.57–1.00)
Glucose: 88 mg/dL (ref 70–99)
Potassium: 4.2 mmol/L (ref 3.5–5.2)
Sodium: 141 mmol/L (ref 134–144)
eGFR: 67 mL/min/{1.73_m2} (ref >59)

## 2022-12-08 ENCOUNTER — Encounter (HOSPITAL_COMMUNITY): Payer: Self-pay

## 2022-12-09 ENCOUNTER — Telehealth (HOSPITAL_COMMUNITY): Payer: Self-pay | Admitting: Emergency Medicine

## 2022-12-09 NOTE — Telephone Encounter (Signed)
Reaching out to patient to offer assistance regarding upcoming cardiac imaging study; pt verbalizes understanding of appt date/time, parking situation and where to check in, pre-test NPO status and medications ordered, and verified current allergies; name and call back number provided for further questions should they arise Hayley Sharpe RN Navigator Cardiac Imaging [facility] [phone] office [phone] cell  

## 2022-12-09 NOTE — Telephone Encounter (Signed)
Attempted to call patient regarding upcoming cardiac CT appointment. °Left message on voicemail with name and callback number °Dwan Fennel RN Navigator Cardiac Imaging °[facility] °[phone] Office °[phone] Cell ° °

## 2022-12-10 ENCOUNTER — Other Ambulatory Visit (HOSPITAL_COMMUNITY): Payer: Self-pay | Admitting: *Deleted

## 2022-12-10 ENCOUNTER — Ambulatory Visit
Admission: RE | Admit: 2022-12-10 | Discharge: 2022-12-10 | Disposition: A | Discharge status: Home / Self Care | Payer: Medicare Other | Source: Ambulatory Visit | Attending: Cardiology | Admitting: Cardiology

## 2022-12-10 DIAGNOSIS — R0609 Other forms of dyspnea: Secondary | ICD-10-CM | POA: Insufficient documentation

## 2022-12-10 MED ORDER — IOHEXOL 350 MG/ML SOLN
75.0000 mL | Freq: Once | INTRAVENOUS | Status: AC | PRN
  Administered 2022-12-10: 75 mL via INTRAVENOUS

## 2022-12-10 MED ORDER — NITROGLYCERIN 0.4 MG SL SUBL
0.8000 mg | SUBLINGUAL_TABLET | Freq: Once | SUBLINGUAL | Status: AC
  Administered 2022-12-10: 0.8 mg via SUBLINGUAL

## 2022-12-10 MED ORDER — SODIUM CHLORIDE 0.9 % IV BOLUS
125.0000 mL | Freq: Once | INTRAVENOUS | Status: AC
  Administered 2022-12-10: 125 mL via INTRAVENOUS

## 2022-12-10 MED ORDER — DILTIAZEM HCL 25 MG/5ML IV SOLN
5.0000 mg | Freq: Once | INTRAVENOUS | Status: AC
  Administered 2022-12-10: 5 mg via INTRAVENOUS

## 2022-12-10 MED ORDER — IVABRADINE HCL 7.5 MG PO TABS: 15.0000 mg | ORAL_TABLET | Freq: Once | ORAL | 0 refills | Status: AC

## 2022-12-10 MED ORDER — METOPROLOL TARTRATE 100 MG PO TABS: 100.0000 mg | ORAL_TABLET | Freq: Once | ORAL | 0 refills | Status: DC

## 2022-12-10 MED ORDER — NITROGLYCERIN 0.4 MG SL SUBL
0.4000 mg | SUBLINGUAL_TABLET | Freq: Once | SUBLINGUAL | Status: AC
  Administered 2022-12-10: 0.4 mg via SUBLINGUAL

## 2022-12-10 MED ORDER — METOPROLOL TARTRATE 5 MG/5ML IV SOLN
10.0000 mg | Freq: Once | INTRAVENOUS | Status: AC | PRN
  Administered 2022-12-10: 10 mg via INTRAVENOUS

## 2022-12-10 MED ORDER — DILTIAZEM HCL 25 MG/5ML IV SOLN
10.0000 mg | INTRAVENOUS | Status: DC | PRN
  Administered 2022-12-10: 10 mg via INTRAVENOUS

## 2022-12-10 NOTE — Progress Notes (Signed)
Patient arrived for Cardiac CT after taking premedications Metoprolol 100mg . Administered additional IV medications able to scan patient however patient moved and needs to be rescanned. Unable to scan patient due to heart rate. Spoke with Dr Azucena Cecil who ordered premedications Metoprolol 100mg  and Ivabradine 15mg . Cardiac navigators notified and patient rescheduled at [facility] on 12/9 at 1345. Patient verbalized understanding of plan of care.

## 2022-12-11 ENCOUNTER — Telehealth (HOSPITAL_COMMUNITY): Payer: Self-pay | Admitting: Emergency Medicine

## 2022-12-11 NOTE — Telephone Encounter (Signed)
Reaching out to patient to offer assistance regarding upcoming cardiac imaging study; pt verbalizes understanding of appt date/time, parking situation and where to check in, pre-test NPO status and medications ordered, and verified current allergies; name and call back number provided for further questions should they arise Alexandria Bond RN Navigator Cardiac Imaging [facility] [phone] office [phone] cell  '100mg'$  metoprolol + '15mg'$  ivabradine

## 2022-12-14 ENCOUNTER — Ambulatory Visit
Admission: RE | Admit: 2022-12-14 | Discharge: 2022-12-14 | Disposition: A | Discharge status: Home / Self Care | Payer: Medicare Other | Source: Ambulatory Visit | Attending: Cardiology | Admitting: Cardiology

## 2022-12-14 DIAGNOSIS — R0609 Other forms of dyspnea: Secondary | ICD-10-CM | POA: Insufficient documentation

## 2022-12-14 DIAGNOSIS — I2510 Atherosclerotic heart disease of native coronary artery without angina pectoris: Secondary | ICD-10-CM | POA: Diagnosis not present

## 2022-12-14 MED ORDER — DILTIAZEM HCL 25 MG/5ML IV SOLN: 10.0000 mg | INTRAVENOUS | Status: DC | PRN

## 2022-12-14 MED ORDER — METOPROLOL TARTRATE 5 MG/5ML IV SOLN: 10.0000 mg | Freq: Once | INTRAVENOUS | Status: DC | PRN

## 2022-12-14 MED ORDER — NITROGLYCERIN 0.4 MG SL SUBL
0.8000 mg | SUBLINGUAL_TABLET | Freq: Once | SUBLINGUAL | Status: DC
  Filled 2022-12-14: qty 25

## 2022-12-14 MED ORDER — IOHEXOL 350 MG/ML SOLN
80.0000 mL | Freq: Once | INTRAVENOUS | Status: AC | PRN
  Administered 2022-12-14: 80 mL via INTRAVENOUS

## 2022-12-18 ENCOUNTER — Encounter: Payer: Self-pay | Admitting: Family Medicine

## 2022-12-21 ENCOUNTER — Ambulatory Visit: Payer: Medicare Other | Attending: Cardiology

## 2022-12-21 DIAGNOSIS — R0609 Other forms of dyspnea: Secondary | ICD-10-CM | POA: Diagnosis not present

## 2022-12-21 LAB — ECHOCARDIOGRAM COMPLETE: S' Lateral: 2.5 cm

## 2023-01-13 ENCOUNTER — Other Ambulatory Visit: Payer: Self-pay | Admitting: Family Medicine

## 2023-01-18 NOTE — Telephone Encounter (Signed)
 Reordered 10/07/22 #135 1 RF  Requested Prescriptions  Refused Prescriptions Disp Refills   traZODone  (DESYREL ) 100 MG tablet [Pharmacy Med Name: TRAZODONE  HCL 100 MG TAB] 135 tablet 1    Sig: TAKE 1-1.5 TABLETS (100-150 MG) BY MOUTH AT BEDTIME     Psychiatry: Antidepressants - Serotonin Modulator Passed - 01/18/2023  8:53 AM      Passed - Valid encounter within last 6 months    Recent Outpatient Visits           3 months ago Mixed hyperlipidemia   [place] [facility] [place], Megan P, DO   9 months ago Encounter for [employer] annual wellness exam   [place] [facility] Arp, Madison, DO   1 year ago Acquired hypothyroidism   [place] [facility] Vicci Duwaine SQUIBB, DO       Future Appointments             In 1 week Agbor-Etang, Redell, MD [facility] [place]   In 2 months Vicci Duwaine SQUIBB, DO [place] [facility]

## 2023-01-29 ENCOUNTER — Ambulatory Visit: Payer: Medicare Other | Admitting: Cardiology

## 2023-03-05 ENCOUNTER — Other Ambulatory Visit: Payer: Self-pay | Admitting: Family Medicine

## 2023-03-08 NOTE — Telephone Encounter (Signed)
 Requested Prescriptions  Pending Prescriptions Disp Refills   potassium chloride (KLOR-CON M) 10 MEQ tablet [Pharmacy Med Name: POTASSIUM CHLORIDE CRYS ER 10 MEQ T] 90 tablet 0    Sig: TAKE ONE TABLET (10 MEQ) BY MOUTH EVERY DAY     Endocrinology:  Minerals - Potassium Supplementation Passed - 03/08/2023 11:36 AM      Passed - K in normal range and within 360 days    Potassium  Date Value Ref Range Status  11/26/2022 4.2 3.5 - 5.2 mmol/L Final         Passed - Cr in normal range and within 360 days    Creatinine, Ser  Date Value Ref Range Status  11/26/2022 0.91 0.57 - 1.00 mg/dL Final         Passed - Valid encounter within last 12 months    Recent Outpatient Visits           5 months ago Mixed hyperlipidemia   [place] [facility] [place], Megan P, DO   11 months ago Encounter for [employer] annual wellness exam   [facility] [place], McL[place], DO   1 year ago Acquired hypothyroidism   [place] [facility] Dorcas Carrow, DO       Future Appointments             In 1 month Laural Benes, Oralia Rud, DO [place] [facility]

## 2023-03-15 ENCOUNTER — Other Ambulatory Visit: Payer: Self-pay | Admitting: Family Medicine

## 2023-03-15 DIAGNOSIS — Z1231 Encounter for screening mammogram for malignant neoplasm of breast: Secondary | ICD-10-CM

## 2023-03-24 ENCOUNTER — Ambulatory Visit
Admission: RE | Admit: 2023-03-24 | Discharge: 2023-03-24 | Disposition: A | Discharge status: Home / Self Care | Payer: Self-pay | Source: Ambulatory Visit | Attending: Family Medicine | Admitting: Family Medicine

## 2023-03-24 DIAGNOSIS — Z1231 Encounter for screening mammogram for malignant neoplasm of breast: Secondary | ICD-10-CM | POA: Insufficient documentation

## 2023-03-26 ENCOUNTER — Encounter: Payer: Self-pay | Admitting: Family Medicine

## 2023-04-08 ENCOUNTER — Ambulatory Visit (INDEPENDENT_AMBULATORY_CARE_PROVIDER_SITE_OTHER): Payer: Self-pay | Admitting: Family Medicine

## 2023-04-08 ENCOUNTER — Encounter: Payer: Self-pay | Admitting: Family Medicine

## 2023-04-08 VITALS — BP 130/78 | HR 86 | Temp 98.0°F | Ht 62.1 in | Wt 156.4 lb

## 2023-04-08 DIAGNOSIS — I10 Essential (primary) hypertension: Secondary | ICD-10-CM

## 2023-04-08 DIAGNOSIS — E782 Mixed hyperlipidemia: Secondary | ICD-10-CM | POA: Diagnosis not present

## 2023-04-08 DIAGNOSIS — Z0000 Encounter for general adult medical examination without abnormal findings: Secondary | ICD-10-CM

## 2023-04-08 DIAGNOSIS — E039 Hypothyroidism, unspecified: Secondary | ICD-10-CM

## 2023-04-08 DIAGNOSIS — D692 Other nonthrombocytopenic purpura: Secondary | ICD-10-CM | POA: Diagnosis not present

## 2023-04-08 MED ORDER — ATORVASTATIN CALCIUM 20 MG PO TABS: 20.0000 mg | ORAL_TABLET | Freq: Every day | ORAL | 1 refills | Status: DC

## 2023-04-08 MED ORDER — HYDROCHLOROTHIAZIDE 25 MG PO TABS: 25.0000 mg | ORAL_TABLET | Freq: Every day | ORAL | 1 refills | Status: DC

## 2023-04-08 MED ORDER — TRAZODONE HCL 100 MG PO TABS: 100.0000 mg | ORAL_TABLET | Freq: Every day | ORAL | 1 refills | Status: DC

## 2023-04-08 MED ORDER — POTASSIUM CHLORIDE CRYS ER 10 MEQ PO TBCR: 10.0000 meq | EXTENDED_RELEASE_TABLET | Freq: Every day | ORAL | 1 refills | Status: DC

## 2023-04-08 NOTE — Assessment & Plan Note (Signed)
 Rechecking labs today. Await results. Treat as needed.

## 2023-04-08 NOTE — Progress Notes (Deleted)
 Subjective:   Alexandria Craig is a 73 y.o. female who presents for Medicare Annual (Subsequent) preventive examination.  Visit Complete: In person  Patient Medicare AWV questionnaire was completed by the patient on 04/08/23; I have confirmed that all information answered by patient is correct and no changes since this date.  Cardiac Risk Factors include: advanced age (>49men, >50 women);dyslipidemia;hypertension     Objective:    Today's Vitals   04/08/23 1309  BP: (!) 142/75  Temp: 98 F (36.7 C)  TempSrc: Oral  SpO2: 98%  Weight: 156 lb 6.4 oz (70.9 kg)  Height: 5' 2.1" (1.577 m)  PainSc: 0-No pain   Body mass index is 28.51 kg/m.     04/08/2023    1:19 PM 07/12/2019   11:43 AM 06/14/2019    8:21 AM 03/24/2015    8:14 PM  Advanced Directives  Does Patient Have a Medical Advance Directive? No No No No  Would patient like information on creating a medical advance directive? Yes (Inpatient - patient defers creating a medical advance directive at this time - Information given) No - Patient declined No - Patient declined Yes - Educational materials given    Current Medications (verified) Outpatient Encounter Medications as of 04/08/2023  Medication Sig   atorvastatin (LIPITOR) 20 MG tablet Take 1 tablet (20 mg total) by mouth daily.   Calcium Carbonate-Vit D-Min (CALCIUM 1200 PO) Take 1 tablet by mouth daily at 2 PM.   hydrochlorothiazide (HYDRODIURIL) 25 MG tablet Take 1 tablet (25 mg total) by mouth daily.   hydrocortisone 2.5 % cream Apply topically.   levothyroxine (SYNTHROID) 75 MCG tablet Take 1 tablet (75 mcg total) by mouth daily before breakfast.   Multiple Vitamins-Minerals (MULTIVITAMIN WITH MINERALS) tablet Take 1 tablet by mouth daily.   potassium chloride (KLOR-CON M) 10 MEQ tablet TAKE ONE TABLET (10 MEQ) BY MOUTH EVERY DAY   traZODone (DESYREL) 100 MG tablet Take 1-1.5 tablets (100-150 mg total) by mouth at bedtime.   [DISCONTINUED] Calcium Carbonate (CALCIUM  600 PO) Take by mouth daily.   [DISCONTINUED] Biotin 96045 MCG TABS Take by mouth. (Patient not taking: Reported on 11/26/2022)   [DISCONTINUED] Cholecalciferol (VITAMIN D3) 50 MCG (2000 UT) CAPS Take by mouth 2 (two) times daily. (Patient not taking: Reported on 11/26/2022)   [DISCONTINUED] Coenzyme Q10 (COQ10) 200 MG CAPS Take by mouth. (Patient not taking: Reported on 11/26/2022)   [DISCONTINUED] Menaquinone-7 (VITAMIN K2) 100 MCG CAPS Take by mouth. (Patient not taking: Reported on 11/26/2022)   [DISCONTINUED] metoprolol tartrate (LOPRESSOR) 100 MG tablet Take 1 tablet (100 mg total) by mouth once for 1 dose. TWO HOURS PRIOR TO CARDIAC CT   [DISCONTINUED] Multiple Vitamin (MULTIVITAMIN) tablet Take 1 tablet by mouth daily.   [DISCONTINUED] Omega-3 Fatty Acids (FISH OIL) 1000 MG CAPS Take by mouth. (Patient not taking: Reported on 11/26/2022)   [DISCONTINUED] polyethylene glycol powder (CLEARLAX) 17 GM/SCOOP powder Take 1 Container by mouth once.   [DISCONTINUED] UNABLE TO FIND Med Name: Gut Health Prebiotic and Probiotic (Patient not taking: Reported on 11/26/2022)   [DISCONTINUED] zinc gluconate 50 MG tablet Take 50 mg by mouth daily. (Patient not taking: Reported on 11/26/2022)   No facility-administered encounter medications on file as of 04/08/2023.    Allergies (verified) Patient has no known allergies.   History: Past Medical History:  Diagnosis Date   Hypertension    Hypothyroidism    Skin cancer    followed by dermatology   Thyroid disease  Past Surgical History:  Procedure Laterality Date   CATARACT EXTRACTION W/PHACO Right 06/14/2019   Procedure: CATARACT EXTRACTION PHACO AND INTRAOCULAR LENS PLACEMENT (IOC) RIGHT;  Surgeon: Lockie Mola, MD;  Location: [facility];  Service: Ophthalmology;  Laterality: Right;  8.13, 0:50, 16.1%   CATARACT EXTRACTION W/PHACO Left 07/12/2019   Procedure: CATARACT EXTRACTION PHACO AND INTRAOCULAR LENS PLACEMENT (IOC) LEFT  5.49 00:51.8 10.6%;  Surgeon: Lockie Mola, MD;  Location: [facility];  Service: Ophthalmology;  Laterality: Left;   CESAREAN SECTION     COLON SURGERY     COLONOSCOPY     COLONOSCOPY WITH PROPOFOL N/A 11/06/2021   Procedure: COLONOSCOPY WITH PROPOFOL;  Surgeon: [name], MD;  Location: [facility];  Service: Gastroenterology;  Laterality: N/A;   EYE SURGERY     Family History  Problem Relation Age of Onset   Heart disease Mother    Breast cancer Mother 47   Alcohol abuse Father    Alcohol abuse Brother    Asthma Daughter    Gout Maternal Grandmother    Social History   Socioeconomic History   Marital status: Married    Spouse name: Not on file   Number of children: Not on file   Years of education: Not on file   Highest education level: Some college, no degree  Occupational History   Not on file  Tobacco Use   Smoking status: Never   Smokeless tobacco: Never  Vaping Use   Vaping status: Never Used  Substance and Sexual Activity   Alcohol use: No   Drug use: No   Sexual activity: Not Currently    Birth control/protection: None  Other Topics Concern   Not on file  Social History Narrative   Not on file   Social Drivers of Health   Financial Resource Strain: Low Risk  (04/07/2023)   Overall Financial Resource Strain (CARDIA)    Difficulty of Paying Living Expenses: Not very hard  Food Insecurity: No Food Insecurity (04/07/2023)   Hunger Vital Sign    Worried About Running Out of Food in the Last Year: Never true    Ran Out of Food in the Last Year: Never true  Transportation Needs: No Transportation Needs (04/07/2023)   PRAPARE - Administrator, Civil Service (Medical): No    Lack of Transportation (Non-Medical): No  Physical Activity: Sufficiently Active (04/07/2023)   Exercise Vital Sign    Days of Exercise per Week: 4 days    Minutes of Exercise per Session: 60 min  Stress: No Stress Concern Present (04/07/2023)   [employer]  of Occupational Health - Occupational Stress Questionnaire    Feeling of Stress : Only a little  Social Connections: Socially Integrated (04/07/2023)   Social Connection and Isolation Panel [NHANES]    Frequency of Communication with Friends and Family: More than three times a week    Frequency of Social Gatherings with Friends and Family: More than three times a week    Attends Religious Services: More than 4 times per year    Active Member of [employer] or Organizations: Yes    Attends Engineer, structural: More than 4 times per year    Marital Status: Married    Tobacco Counseling Counseling given: Not Answered   Clinical Intake:  Pre-visit preparation completed: Yes  Pain : No/denies pain Pain Score: 0-No pain        How often do you need to have someone help you when you read instructions,  pamphlets, or other written materials from your doctor or pharmacy?: 1 - Never What is the last grade level you completed in school?: 12th grade, took some college courses  Interpreter Needed?: No      Activities of Daily Living    04/08/2023    1:13 PM  In your present state of health, do you have any difficulty performing the following activities:  Hearing? 0  Vision? 0  Difficulty concentrating or making decisions? 0  Walking or climbing stairs? 0  Dressing or bathing? 0  Doing errands, shopping? 0  Preparing Food and eating ? N  Using the Toilet? N  In the past six months, have you accidently leaked urine? N  Do you have problems with loss of bowel control? N  Managing your Medications? N  Managing your Finances? N  Housekeeping or managing your Housekeeping? N    Patient Care Team: Dorcas Carrow, DO as PCP - General (Family Medicine) Debbe Odea, MD as PCP - Cardiology (Cardiology)  Indicate any recent Medical Services you may have received from other than [facility] providers in the past year (date may be approximate).     Assessment:   This is a routine  wellness examination for [place].  Hearing/Vision screen No results found.   Goals Addressed   None    Depression Screen    10/07/2022   10:04 AM 04/07/2022    9:20 AM 10/06/2021    2:25 PM  PHQ 2/9 Scores  PHQ - 2 Score 0 0 0  PHQ- 9 Score 2 0 2    Fall Risk    10/07/2022   10:04 AM 04/07/2022    9:20 AM 10/06/2021    2:25 PM  Fall Risk   Falls in the past year? 0 0 0  Number falls in past yr: 0 0 0  Injury with Fall? 0 0 0  Risk for fall due to : No Fall Risks No Fall Risks No Fall Risks  Follow up Falls evaluation completed Falls evaluation completed Falls evaluation completed    MEDICARE RISK AT HOME: Medicare Risk at Home Any stairs in or around the home?: Yes If so, are there any without handrails?: No Home free of loose throw rugs in walkways, pet beds, electrical cords, etc?: Yes Adequate lighting in your home to reduce risk of falls?: Yes Life alert?: No Use of a cane, walker or w/c?: No Grab bars in the bathroom?: Yes Shower chair or bench in shower?: No Elevated toilet seat or a handicapped toilet?: No  TIMED UP AND GO:  Was the test performed?  {[id number]}    Cognitive Function:        04/08/2023    1:21 PM 04/07/2022    9:47 AM  6CIT Screen  What Year? 0 points 0 points  What month? 0 points 0 points  What time? 0 points 0 points  Count back from 20 0 points 0 points  Months in reverse 0 points 4 points  Repeat phrase 2 points 0 points  Total Score 2 points 4 points    Immunizations Immunization History  Administered Date(s) Administered   Fluad Quad(high Dose 65+) 10/06/2021   Fluad Trivalent(High Dose 65+) 10/07/2022   Influenza,inj,quad, With Preservative 09/18/2020   Moderna SARS-COV2 Booster Vaccination 12/07/2019   Moderna Sars-Covid-2 Vaccination 02/10/2019, 03/10/2019   Td 10/15/2016   Tdap 06/17/2021   Zoster Recombinant(Shingrix) 08/18/2019, 11/09/2019    {TDAP status:2101805}  {Flu Vaccine  status:2101806}  {Pneumococcal vaccine status:2101807}  {Covid-19 vaccine  status:2101808}  Qualifies for Shingles Vaccine? {YES/NO:21197}  Zostavax completed {YES/NO:21197}  {Shingrix Completed?:2101804}  Screening Tests Health Maintenance  Topic Date Due   COVID-19 Vaccine (5 - 2024-25 season) 09/06/2022   Medicare Annual Wellness (AWV)  04/07/2023   INFLUENZA VACCINE  08/06/2023   MAMMOGRAM  03/23/2025   DEXA SCAN  03/16/2027   DTaP/Tdap/Td (3 - Td or Tdap) 06/18/2031   Colonoscopy  11/07/2031   Pneumonia Vaccine 23+ Years old  Completed   Hepatitis C Screening  Completed   Zoster Vaccines- Shingrix  Completed   HPV VACCINES  Aged Out    Health Maintenance  Health Maintenance Due  Topic Date Due   COVID-19 Vaccine (5 - 2024-25 season) 09/06/2022   Medicare Annual Wellness (AWV)  04/07/2023    Colorectal cancer screening: Type of screening: Colonoscopy. Completed 11/06/21. Repeat every 10 years  Mammogram status: Completed 03/24/23. Repeat every year  Bone Density status: Completed 03/16/22. Results reflect: Bone density results: NORMAL. Repeat every 5 years.  Lung Cancer Screening: (Low Dose CT Chest recommended if Age 108-80 years, 20 pack-year currently smoking OR have quit w/in 15years.) does not qualify.   Lung Cancer Screening Referral: N/A  Additional Screening:  Hepatitis C Screening: does qualify; Completed 10/06/21  Vision Screening: Recommended annual ophthalmology exams for early detection of glaucoma and other disorders of the eye. Is the patient up to date with their annual eye exam?  {YES/NO:21197} Who is the provider or what is the name of the office in which the patient attends annual eye exams? *** If pt is not established with a provider, would they like to be referred to a provider to establish care? {YES/NO:21197}.   Dental Screening: Recommended annual dental exams for proper oral hygiene  Diabetic Foot Exam: {Diabetic Foot  Exam:2101802}  Community Resource Referral / Chronic Care Management: CRR required this visit?  No   CCM required this visit?  No     Plan:     I have personally reviewed and noted the following in the patient's chart:   Medical and social history Use of alcohol, tobacco or illicit drugs  Current medications and supplements including opioid prescriptions. Patient is not currently taking opioid prescriptions. Functional ability and status Nutritional status Physical activity Advanced directives List of other physicians Hospitalizations, surgeries, and ER visits in previous 12 months Vitals Screenings to include cognitive, depression, and falls Referrals and appointments  In addition, I have reviewed and discussed with patient certain preventive protocols, quality metrics, and best practice recommendations. A written personalized care plan for preventive services as well as general preventive health recommendations were provided to patient.     Pablo Ledger, CMA   04/08/2023   After Visit Summary: (In Person-Printed) AVS printed and given to the patient  Nurse Notes: ***

## 2023-04-08 NOTE — Assessment & Plan Note (Signed)
 Under good control on current regimen. Continue current regimen. Continue to monitor. Call with any concerns. Refills given. Labs drawn today.

## 2023-04-08 NOTE — Progress Notes (Signed)
 Subjective:   Alexandria Craig is a 73 y.o. female who presents for Medicare Annual (Subsequent) preventive examination.  Visit Complete: In person  Patient Medicare AWV questionnaire was completed by the patient on 04/08/23; I have confirmed that all information answered by patient is correct and no changes since this date.  Cardiac Risk Factors include: advanced age (>63men, >21 women);dyslipidemia;hypertension     Objective:    Today's Vitals   04/08/23 1309 04/08/23 1325  BP: (!) 142/75 130/78  Pulse:  86  Temp: 98 F (36.7 C)   TempSrc: Oral   SpO2: 98%   Weight: 156 lb 6.4 oz (70.9 kg)   Height: 5' 2.1" (1.577 m)   PainSc: 0-No pain    Body mass index is 28.51 kg/m.     04/08/2023    1:19 PM 07/12/2019   11:43 AM 06/14/2019    8:21 AM 03/24/2015    8:14 PM  Advanced Directives  Does Patient Have a Medical Advance Directive? No No No No  Would patient like information on creating a medical advance directive? Yes (Inpatient - patient defers creating a medical advance directive at this time - Information given) No - Patient declined No - Patient declined Yes - Educational materials given    Current Medications (verified) Outpatient Encounter Medications as of 04/08/2023  Medication Sig   Calcium Carbonate-Vit D-Min (CALCIUM 1200 PO) Take 1 tablet by mouth daily at 2 PM.   hydrocortisone 2.5 % cream Apply topically.   levothyroxine (SYNTHROID) 75 MCG tablet Take 1 tablet (75 mcg total) by mouth daily before breakfast.   Multiple Vitamins-Minerals (MULTIVITAMIN WITH MINERALS) tablet Take 1 tablet by mouth daily.   [DISCONTINUED] atorvastatin (LIPITOR) 20 MG tablet Take 1 tablet (20 mg total) by mouth daily.   [DISCONTINUED] Calcium Carbonate (CALCIUM 600 PO) Take by mouth daily.   [DISCONTINUED] hydrochlorothiazide (HYDRODIURIL) 25 MG tablet Take 1 tablet (25 mg total) by mouth daily.   [DISCONTINUED] potassium chloride (KLOR-CON M) 10 MEQ tablet TAKE ONE TABLET (10 MEQ) BY  MOUTH EVERY DAY   [DISCONTINUED] traZODone (DESYREL) 100 MG tablet Take 1-1.5 tablets (100-150 mg total) by mouth at bedtime.   atorvastatin (LIPITOR) 20 MG tablet Take 1 tablet (20 mg total) by mouth daily.   hydrochlorothiazide (HYDRODIURIL) 25 MG tablet Take 1 tablet (25 mg total) by mouth daily.   potassium chloride (KLOR-CON M) 10 MEQ tablet Take 1 tablet (10 mEq total) by mouth daily.   traZODone (DESYREL) 100 MG tablet Take 1-1.5 tablets (100-150 mg total) by mouth at bedtime.   [DISCONTINUED] Biotin 21308 MCG TABS Take by mouth. (Patient not taking: Reported on 11/26/2022)   [DISCONTINUED] Cholecalciferol (VITAMIN D3) 50 MCG (2000 UT) CAPS Take by mouth 2 (two) times daily. (Patient not taking: Reported on 11/26/2022)   [DISCONTINUED] Coenzyme Q10 (COQ10) 200 MG CAPS Take by mouth. (Patient not taking: Reported on 11/26/2022)   [DISCONTINUED] Menaquinone-7 (VITAMIN K2) 100 MCG CAPS Take by mouth. (Patient not taking: Reported on 11/26/2022)   [DISCONTINUED] metoprolol tartrate (LOPRESSOR) 100 MG tablet Take 1 tablet (100 mg total) by mouth once for 1 dose. TWO HOURS PRIOR TO CARDIAC CT   [DISCONTINUED] Multiple Vitamin (MULTIVITAMIN) tablet Take 1 tablet by mouth daily.   [DISCONTINUED] Omega-3 Fatty Acids (FISH OIL) 1000 MG CAPS Take by mouth. (Patient not taking: Reported on 11/26/2022)   [DISCONTINUED] polyethylene glycol powder (CLEARLAX) 17 GM/SCOOP powder Take 1 Container by mouth once.   [DISCONTINUED] UNABLE TO FIND Med Name: Gut Health  Prebiotic and Probiotic (Patient not taking: Reported on 11/26/2022)   [DISCONTINUED] zinc gluconate 50 MG tablet Take 50 mg by mouth daily. (Patient not taking: Reported on 11/26/2022)   No facility-administered encounter medications on file as of 04/08/2023.    Allergies (verified) Patient has no known allergies.   History: Past Medical History:  Diagnosis Date   Hypertension    Hypothyroidism    Skin cancer    followed by dermatology    Thyroid disease    Past Surgical History:  Procedure Laterality Date   CATARACT EXTRACTION W/PHACO Right 06/14/2019   Procedure: CATARACT EXTRACTION PHACO AND INTRAOCULAR LENS PLACEMENT (IOC) RIGHT;  Surgeon: Lockie Mola, MD;  Location: [facility];  Service: Ophthalmology;  Laterality: Right;  8.13, 0:50, 16.1%   CATARACT EXTRACTION W/PHACO Left 07/12/2019   Procedure: CATARACT EXTRACTION PHACO AND INTRAOCULAR LENS PLACEMENT (IOC) LEFT 5.49 00:51.8 10.6%;  Surgeon: Lockie Mola, MD;  Location: [facility];  Service: Ophthalmology;  Laterality: Left;   CESAREAN SECTION     COLON SURGERY     COLONOSCOPY     COLONOSCOPY WITH PROPOFOL N/A 11/06/2021   Procedure: COLONOSCOPY WITH PROPOFOL;  Surgeon: [name], MD;  Location: [facility];  Service: Gastroenterology;  Laterality: N/A;   EYE SURGERY     Family History  Problem Relation Age of Onset   Heart disease Mother    Breast cancer Mother 32   Alcohol abuse Father    Alcohol abuse Brother    Asthma Daughter    Gout Maternal Grandmother    Social History   Socioeconomic History   Marital status: Married    Spouse name: Not on file   Number of children: Not on file   Years of education: Not on file   Highest education level: Some college, no degree  Occupational History   Not on file  Tobacco Use   Smoking status: Never   Smokeless tobacco: Never  Vaping Use   Vaping status: Never Used  Substance and Sexual Activity   Alcohol use: No   Drug use: No   Sexual activity: Not Currently    Birth control/protection: None  Other Topics Concern   Not on file  Social History Narrative   Not on file   Social Drivers of Health   Financial Resource Strain: Low Risk  (04/07/2023)   Overall Financial Resource Strain (CARDIA)    Difficulty of Paying Living Expenses: Not very hard  Food Insecurity: No Food Insecurity (04/07/2023)   Hunger Vital Sign    Worried About Running Out of Food in the Last  Year: Never true    Ran Out of Food in the Last Year: Never true  Transportation Needs: No Transportation Needs (04/07/2023)   PRAPARE - Administrator, Civil Service (Medical): No    Lack of Transportation (Non-Medical): No  Physical Activity: Sufficiently Active (04/07/2023)   Exercise Vital Sign    Days of Exercise per Week: 4 days    Minutes of Exercise per Session: 60 min  Stress: No Stress Concern Present (04/07/2023)   [employer] of Occupational Health - Occupational Stress Questionnaire    Feeling of Stress : Only a little  Social Connections: Socially Integrated (04/07/2023)   Social Connection and Isolation Panel [NHANES]    Frequency of Communication with Friends and Family: More than three times a week    Frequency of Social Gatherings with Friends and Family: More than three times a week    Attends Religious Services:  More than 4 times per year    Active Member of Clubs or Organizations: Yes    Attends Banker Meetings: More than 4 times per year    Marital Status: Married    Tobacco Counseling Counseling given: Not Answered   Clinical Intake:  Pre-visit preparation completed: Yes  Pain : No/denies pain Pain Score: 0-No pain        How often do you need to have someone help you when you read instructions, pamphlets, or other written materials from your doctor or pharmacy?: 1 - Never What is the last grade level you completed in school?: 12th grade, took some college courses  Interpreter Needed?: No      Activities of Daily Living    04/08/2023    1:13 PM  In your present state of health, do you have any difficulty performing the following activities:  Hearing? 0  Vision? 0  Difficulty concentrating or making decisions? 0  Walking or climbing stairs? 0  Dressing or bathing? 0  Doing errands, shopping? 0  Preparing Food and eating ? N  Using the Toilet? N  In the past six months, have you accidently leaked urine? N  Do you  have problems with loss of bowel control? N  Managing your Medications? N  Managing your Finances? N  Housekeeping or managing your Housekeeping? N    Patient Care Team: Dorcas Carrow, DO as PCP - General (Family Medicine) Debbe Odea, MD as PCP - Cardiology (Cardiology)  Indicate any recent Medical Services you may have received from other than [facility] providers in the past year (date may be approximate).     Assessment:   This is a routine wellness examination for Roy.   Goals Addressed   None   Depression Screen    04/08/2023    1:27 PM 10/07/2022   10:04 AM 04/07/2022    9:20 AM 10/06/2021    2:25 PM  PHQ 2/9 Scores  PHQ - 2 Score 0 0 0 0  PHQ- 9 Score 0 2 0 2    Fall Risk    04/08/2023    1:27 PM 10/07/2022   10:04 AM 04/07/2022    9:20 AM 10/06/2021    2:25 PM  Fall Risk   Falls in the past year? 0 0 0 0  Number falls in past yr: 0 0 0 0  Injury with Fall? 0 0 0 0  Risk for fall due to : No Fall Risks No Fall Risks No Fall Risks No Fall Risks  Follow up Falls evaluation completed Falls evaluation completed Falls evaluation completed Falls evaluation completed    MEDICARE RISK AT HOME: Medicare Risk at Home Any stairs in or around the home?: Yes If so, are there any without handrails?: No Home free of loose throw rugs in walkways, pet beds, electrical cords, etc?: Yes Adequate lighting in your home to reduce risk of falls?: Yes Life alert?: No Use of a cane, walker or w/c?: No Grab bars in the bathroom?: Yes Shower chair or bench in shower?: No Elevated toilet seat or a handicapped toilet?: No  TIMED UP AND GO:  Was the test performed?  Yes  Length of time to ambulate 10 feet: 3 sec Gait steady and fast without use of assistive device    Cognitive Function:        04/08/2023    1:21 PM 04/07/2022    9:47 AM  6CIT Screen  What Year? 0 points 0 points  What month? 0 points 0 points  What time? 0 points 0 points  Count back from 20 0 points 0  points  Months in reverse 0 points 4 points  Repeat phrase 2 points 0 points  Total Score 2 points 4 points    Immunizations Immunization History  Administered Date(s) Administered   Fluad Quad(high Dose 65+) 10/06/2021   Fluad Trivalent(High Dose 65+) 10/07/2022   Hepatitis B, ADULT 07/02/2006, 07/30/2006, 10/25/2006   Influenza,inj,quad, With Preservative 09/18/2020   Moderna Covid-19 Vaccine Bivalent Booster 58yrs & up 11/01/2020   Moderna SARS-COV2 Booster Vaccination 12/07/2019   Moderna Sars-Covid-2 Vaccination 02/10/2019, 03/10/2019   Pneumococcal Conjugate-13 12/23/2017   Pneumococcal Polysaccharide-23 02/17/2021   Td 10/15/2016   Tdap 06/17/2021   Zoster Recombinant(Shingrix) 08/18/2019, 11/09/2019    TDAP status: Up to date  Flu Vaccine status: Up to date  Pneumococcal vaccine status: Up to date  Covid-19 vaccine status: Declined, Education has been provided regarding the importance of this vaccine but patient still declined. Advised may receive this vaccine at local pharmacy or Health Dept.or vaccine clinic. Aware to provide a copy of the vaccination record if obtained from local pharmacy or Health Dept. Verbalized acceptance and understanding.  Qualifies for Shingles Vaccine? Yes   Zostavax completed Yes     Screening Tests Health Maintenance  Topic Date Due   INFLUENZA VACCINE  08/06/2023   Medicare Annual Wellness (AWV)  04/07/2024   MAMMOGRAM  03/23/2025   DEXA SCAN  03/16/2027   DTaP/Tdap/Td (3 - Td or Tdap) 06/18/2031   Colonoscopy  11/07/2031   Pneumonia Vaccine 43+ Years old  Completed   Hepatitis C Screening  Completed   Zoster Vaccines- Shingrix  Completed   HPV VACCINES  Aged Out   COVID-19 Vaccine  Discontinued    Health Maintenance  There are no preventive care reminders to display for this patient.   Colorectal cancer screening: Type of screening: Colonoscopy. Completed 11/06/21. Repeat every 10 years  Mammogram status: Completed  03/24/23. Repeat every year  Bone Density status: Completed 03/16/22. Results reflect: Bone density results: OSTEOPENIA. Repeat every 3 years.  Lung Cancer Screening: (Low Dose CT Chest recommended if Age 78-80 years, 20 pack-year currently smoking OR have quit w/in 15years.) does not qualify.   Additional Screening:  Hepatitis C Screening: does qualify; Completed 10/06/21  Dental Screening: Recommended annual dental exams for proper oral hygiene  Community Resource Referral / Chronic Care Management: CRR required this visit?  No   CCM required this visit?  No     Plan:     I have personally reviewed and noted the following in the patient's chart:   Medical and social history Use of alcohol, tobacco or illicit drugs  Current medications and supplements including opioid prescriptions. Patient is not currently taking opioid prescriptions. Functional ability and status Nutritional status Physical activity Advanced directives List of other physicians Hospitalizations, surgeries, and ER visits in previous 12 months Vitals Screenings to include cognitive, depression, and falls Referrals and appointments  In addition, I have reviewed and discussed with patient certain preventive protocols, quality metrics, and best practice recommendations. A written personalized care plan for preventive services as well as general preventive health recommendations were provided to patient.     Olevia Perches, DO   04/08/2023   After Visit Summary: (In Person-Printed) AVS printed and given to the patient

## 2023-04-08 NOTE — Progress Notes (Signed)
 BP 130/78 (BP Location: Left Arm, Cuff Size: Normal)   Pulse 86   Temp 98 F (36.7 C) (Oral)   Ht 5' 2.1" (1.577 m)   Wt 156 lb 6.4 oz (70.9 kg)   SpO2 98%   BMI 28.51 kg/m    Subjective:    Patient ID: Alexandria Craig, female    DOB: 1950-07-30, 73 y.o.   MRN: [id number]  HPI: Alexandria Craig is a 73 y.o. female presenting on 04/08/2023 for comprehensive medical examination. Current medical complaints include:  HYPERTENSION / HYPERLIPIDEMIA Satisfied with current treatment? yes Duration of hypertension: chronic BP monitoring frequency: not checking BP medication side effects: no Past BP meds: hydrochlorothiazide,  Duration of hyperlipidemia: chronic Cholesterol medication side effects: no Cholesterol supplements: none Past cholesterol medications: atorvastatin Medication compliance: excellent compliance Aspirin: no Recent stressors: no Recurrent headaches: no Visual changes: no Palpitations: no Dyspnea: no Chest pain: no Lower extremity edema: no Dizzy/lightheaded: no  HYPOTHYROIDISM Thyroid control status:controlled Satisfied with current treatment? yes Medication side effects: no Medication compliance: excellent compliance Recent dose adjustment:no Fatigue: no Cold intolerance: no Heat intolerance: no Weight gain: no Weight loss: no Constipation: yes Diarrhea/loose stools: no Palpitations: no Lower extremity edema: no Anxiety/depressed mood: no  She currently lives with: husband Menopausal Symptoms: no  Depression Screen done today and results listed below:     04/08/2023    1:27 PM 10/07/2022   10:04 AM 04/07/2022    9:20 AM 10/06/2021    2:25 PM  Depression screen PHQ 2/9  Decreased Interest 0 0 0 0  Down, Depressed, Hopeless 0 0 0 0  PHQ - 2 Score 0 0 0 0  Altered sleeping 0 1 0 0  Tired, decreased energy 0 1 0 2  Change in appetite 0 0 0 0  Feeling bad or failure about yourself  0 0 0 0  Trouble concentrating 0 0 0 0  Moving slowly or  fidgety/restless 0 0 0 0  Suicidal thoughts 0 0 0 0  PHQ-9 Score 0 2 0 2  Difficult doing work/chores Not difficult at all Not difficult at all Not difficult at all Not difficult at all    Past Medical History:  Past Medical History:  Diagnosis Date   Hypertension    Hypothyroidism    Skin cancer    followed by dermatology   Thyroid disease     Surgical History:  Past Surgical History:  Procedure Laterality Date   CATARACT EXTRACTION W/PHACO Right 06/14/2019   Procedure: CATARACT EXTRACTION PHACO AND INTRAOCULAR LENS PLACEMENT (IOC) RIGHT;  Surgeon: Lockie Mola, MD;  Location: [facility];  Service: Ophthalmology;  Laterality: Right;  8.13, 0:50, 16.1%   CATARACT EXTRACTION W/PHACO Left 07/12/2019   Procedure: CATARACT EXTRACTION PHACO AND INTRAOCULAR LENS PLACEMENT (IOC) LEFT 5.49 00:51.8 10.6%;  Surgeon: Lockie Mola, MD;  Location: [facility];  Service: Ophthalmology;  Laterality: Left;   CESAREAN SECTION     COLON SURGERY     COLONOSCOPY     COLONOSCOPY WITH PROPOFOL N/A 11/06/2021   Procedure: COLONOSCOPY WITH PROPOFOL;  Surgeon: [name], MD;  Location: [facility];  Service: Gastroenterology;  Laterality: N/A;   EYE SURGERY      Medications:  Current Outpatient Medications on File Prior to Visit  Medication Sig   Calcium Carbonate-Vit D-Min (CALCIUM 1200 PO) Take 1 tablet by mouth daily at 2 PM.   hydrocortisone 2.5 % cream Apply topically.   levothyroxine (SYNTHROID) 75 MCG tablet Take 1  tablet (75 mcg total) by mouth daily before breakfast.   Multiple Vitamins-Minerals (MULTIVITAMIN WITH MINERALS) tablet Take 1 tablet by mouth daily.   No current facility-administered medications on file prior to visit.    Allergies:  No Known Allergies  Social History:  Social History   Socioeconomic History   Marital status: Married    Spouse name: Not on file   Number of children: Not on file   Years of education: Not on file    Highest education level: Some college, no degree  Occupational History   Not on file  Tobacco Use   Smoking status: Never   Smokeless tobacco: Never  Vaping Use   Vaping status: Never Used  Substance and Sexual Activity   Alcohol use: No   Drug use: No   Sexual activity: Not Currently    Birth control/protection: None  Other Topics Concern   Not on file  Social History Narrative   Not on file   Social Drivers of Health   Financial Resource Strain: Low Risk  (04/07/2023)   Overall Financial Resource Strain (CARDIA)    Difficulty of Paying Living Expenses: Not very hard  Food Insecurity: No Food Insecurity (04/07/2023)   Hunger Vital Sign    Worried About Running Out of Food in the Last Year: Never true    Ran Out of Food in the Last Year: Never true  Transportation Needs: No Transportation Needs (04/07/2023)   PRAPARE - Administrator, Civil Service (Medical): No    Lack of Transportation (Non-Medical): No  Physical Activity: Sufficiently Active (04/07/2023)   Exercise Vital Sign    Days of Exercise per Week: 4 days    Minutes of Exercise per Session: 60 min  Stress: No Stress Concern Present (04/07/2023)   [employer] of Occupational Health - Occupational Stress Questionnaire    Feeling of Stress : Only a little  Social Connections: Socially Integrated (04/07/2023)   Social Connection and Isolation Panel [NHANES]    Frequency of Communication with Friends and Family: More than three times a week    Frequency of Social Gatherings with Friends and Family: More than three times a week    Attends Religious Services: More than 4 times per year    Active Member of [employer] or Organizations: Yes    Attends Engineer, structural: More than 4 times per year    Marital Status: Married  Catering manager Violence: Not At Risk (04/08/2023)   Humiliation, Afraid, Rape, and Kick questionnaire    Fear of Current or Ex-Partner: No    Emotionally Abused: No    Physically  Abused: No    Sexually Abused: No   Social History   Tobacco Use  Smoking Status Never  Smokeless Tobacco Never   Social History   Substance and Sexual Activity  Alcohol Use No    Family History:  Family History  Problem Relation Age of Onset   Heart disease Mother    Breast cancer Mother 89   Alcohol abuse Father    Alcohol abuse Brother    Asthma Daughter    Gout Maternal Grandmother     Past medical history, surgical history, medications, allergies, family history and social history reviewed with patient today and changes made to appropriate areas of the chart.   Review of Systems  Constitutional: Negative.   HENT: Negative.    Eyes: Negative.   Respiratory: Negative.    Cardiovascular:  Positive for leg swelling. Negative for chest pain,  palpitations, orthopnea, claudication and PND.  Gastrointestinal:  Positive for constipation and heartburn. Negative for abdominal pain, blood in stool, diarrhea, melena, nausea and vomiting.  Genitourinary: Negative.   Musculoskeletal:  Positive for joint pain. Negative for back pain, falls, myalgias and neck pain.  Skin: Negative.   Neurological: Negative.   Endo/Heme/Allergies:  Negative for environmental allergies and polydipsia. Bruises/bleeds easily.  Psychiatric/Behavioral: Negative.     All other ROS negative except what is listed above and in the HPI.      Objective:    BP 130/78 (BP Location: Left Arm, Cuff Size: Normal)   Pulse 86   Temp 98 F (36.7 C) (Oral)   Ht 5' 2.1" (1.577 m)   Wt 156 lb 6.4 oz (70.9 kg)   SpO2 98%   BMI 28.51 kg/m   Wt Readings from Last 3 Encounters:  04/08/23 156 lb 6.4 oz (70.9 kg)  11/26/22 156 lb 9.6 oz (71 kg)  10/07/22 156 lb 3.2 oz (70.9 kg)    Physical Exam Vitals and nursing note reviewed.  Constitutional:      General: She is not in acute distress.    Appearance: Normal appearance. She is not ill-appearing, toxic-appearing or diaphoretic.  HENT:     Head:  Normocephalic and atraumatic.     Right Ear: Tympanic membrane, ear canal and external ear normal. There is no impacted cerumen.     Left Ear: Tympanic membrane, ear canal and external ear normal. There is no impacted cerumen.     Nose: Nose normal. No congestion or rhinorrhea.     Mouth/Throat:     Mouth: Mucous membranes are moist.     Pharynx: Oropharynx is clear. No oropharyngeal exudate or posterior oropharyngeal erythema.  Eyes:     General: No scleral icterus.       Right eye: No discharge.        Left eye: No discharge.     Extraocular Movements: Extraocular movements intact.     Conjunctiva/sclera: Conjunctivae normal.     Pupils: Pupils are equal, round, and reactive to light.  Neck:     Vascular: No carotid bruit.  Cardiovascular:     Rate and Rhythm: Normal rate and regular rhythm.     Pulses: Normal pulses.     Heart sounds: No murmur heard.    No friction rub. No gallop.  Pulmonary:     Effort: Pulmonary effort is normal. No respiratory distress.     Breath sounds: Normal breath sounds. No stridor. No wheezing, rhonchi or rales.  Chest:     Chest wall: No tenderness.  Abdominal:     General: Abdomen is flat. Bowel sounds are normal. There is no distension.     Palpations: Abdomen is soft. There is no mass.     Tenderness: There is no abdominal tenderness. There is no right CVA tenderness, left CVA tenderness, guarding or rebound.     Hernia: No hernia is present.  Genitourinary:    Comments: Breast and pelvic exams deferred with shared decision making Musculoskeletal:        General: No swelling, tenderness, deformity or signs of injury.     Cervical back: Normal range of motion and neck supple. No rigidity. No muscular tenderness.     Right lower leg: No edema.     Left lower leg: No edema.  Lymphadenopathy:     Cervical: No cervical adenopathy.  Skin:    General: Skin is warm and dry.     Capillary  Refill: Capillary refill takes less than 2 seconds.      Coloration: Skin is not jaundiced or pale.     Findings: No bruising, erythema, lesion or rash.  Neurological:     General: No focal deficit present.     Mental Status: She is alert and oriented to person, place, and time. Mental status is at baseline.     Cranial Nerves: No cranial nerve deficit.     Sensory: No sensory deficit.     Motor: No weakness.     Coordination: Coordination normal.     Gait: Gait normal.     Deep Tendon Reflexes: Reflexes normal.  Psychiatric:        Mood and Affect: Mood normal.        Behavior: Behavior normal.        Thought Content: Thought content normal.        Judgment: Judgment normal.     Results for orders placed or performed in visit on 12/21/22  ECHOCARDIOGRAM COMPLETE   Collection Time: 12/21/22  1:26 PM  Result Value Ref Range   S' Lateral 2.50 cm   Est EF 60 - 65%       Assessment & Plan:   Problem List Items Addressed This Visit       Cardiovascular and Mediastinum   Hypertension   Under good control on current regimen. Continue current regimen. Continue to monitor. Call with any concerns. Refills given. Labs drawn today.       Relevant Medications   atorvastatin (LIPITOR) 20 MG tablet   hydrochlorothiazide (HYDRODIURIL) 25 MG tablet   Other Relevant Orders   CBC with Differential/Platelet   Comprehensive metabolic panel with GFR   Urine Microalbumin w/creat. ratio   Senile purpura (HCC)   Rechecking labs today. Await results. Treat as needed.       Relevant Medications   atorvastatin (LIPITOR) 20 MG tablet   hydrochlorothiazide (HYDRODIURIL) 25 MG tablet   Other Relevant Orders   CBC with Differential/Platelet   Comprehensive metabolic panel with GFR     Endocrine   Hypothyroidism   Rechecking labs today. Await results. Treat as needed.       Relevant Orders   CBC with Differential/Platelet   Comprehensive metabolic panel with GFR   TSH     Other   Hyperlipidemia   Under good control on current regimen.  Continue current regimen. Continue to monitor. Call with any concerns. Refills given. Labs drawn today.        Relevant Medications   atorvastatin (LIPITOR) 20 MG tablet   hydrochlorothiazide (HYDRODIURIL) 25 MG tablet   Other Relevant Orders   CBC with Differential/Platelet   Comprehensive metabolic panel with GFR   Lipid Panel w/o Chol/HDL Ratio   Other Visit Diagnoses       Encounter for Medicare annual wellness exam    -  Primary   Preventative care discussed today as below.     Routine general medical examination at a health care facility       Vaccines up to date. Screening labs checked today. Mammo, Colonoscopy and DEXA up to date. Continue diet and exercise. Call with any concerns.        Follow up plan: Return in about 6 months (around 10/08/2023).   LABORATORY TESTING:  - Pap smear: not applicable  IMMUNIZATIONS:   - Tdap: Tetanus vaccination status reviewed: last tetanus booster within 10 years. - Influenza: Up to date - Pneumovax: Up to date - Prevnar: Up  to date - COVID: Refused - HPV: Not applicable - Shingrix vaccine: Up to date  SCREENING: -Mammogram: Up to date  - Colonoscopy: Up to date  - Bone Density: Up to date   PATIENT COUNSELING:   Advised to take 1 mg of folate supplement per day if capable of pregnancy.   Sexuality: Discussed sexually transmitted diseases, partner selection, use of condoms, avoidance of unintended pregnancy  and contraceptive alternatives.   Advised to avoid cigarette smoking.  I discussed with the patient that most people either abstain from alcohol or drink within safe limits (<=14/week and <=4 drinks/occasion for males, <=7/weeks and <= 3 drinks/occasion for females) and that the risk for alcohol disorders and other health effects rises proportionally with the number of drinks per week and how often a drinker exceeds daily limits.  Discussed cessation/primary prevention of drug use and availability of treatment for  abuse.   Diet: Encouraged to adjust caloric intake to maintain  or achieve ideal body weight, to reduce intake of dietary saturated fat and total fat, to limit sodium intake by avoiding high sodium foods and not adding table salt, and to maintain adequate dietary potassium and calcium preferably from fresh fruits, vegetables, and low-fat dairy products.    stressed the importance of regular exercise  Injury prevention: Discussed safety belts, safety helmets, smoke detector, smoking near bedding or upholstery.   Dental health: Discussed importance of regular tooth brushing, flossing, and dental visits.    NEXT PREVENTATIVE PHYSICAL DUE IN 1 YEAR. Return in about 6 months (around 10/08/2023).

## 2023-04-08 NOTE — Patient Instructions (Signed)
  Alexandria Craig , Thank you for taking time to come for your Medicare Wellness Visit. I appreciate your ongoing commitment to your health goals. Please review the following plan we discussed and let me know if I can assist you in the future.   These are the goals we discussed:  Goals   None     This is a list of the screening recommended for you and due dates:  Health Maintenance  Topic Date Due   COVID-19 Vaccine (5 - 2024-25 season) 09/06/2022   Medicare Annual Wellness Visit  04/07/2023   Flu Shot  08/06/2023   Mammogram  03/23/2025   DEXA scan (bone density measurement)  03/16/2027   DTaP/Tdap/Td vaccine (3 - Td or Tdap) 06/18/2031   Colon Cancer Screening  11/07/2031   Pneumonia Vaccine  Completed   Hepatitis C Screening  Completed   Zoster (Shingles) Vaccine  Completed   HPV Vaccine  Aged Out

## 2023-04-09 ENCOUNTER — Other Ambulatory Visit: Payer: Self-pay | Admitting: Family Medicine

## 2023-04-09 ENCOUNTER — Encounter: Payer: Self-pay | Admitting: Family Medicine

## 2023-04-09 LAB — COMPREHENSIVE METABOLIC PANEL WITH GFR
ALT: 14 IU/L (ref 0–32)
AST: 23 IU/L (ref 0–40)
Albumin: 4.5 g/dL (ref 3.8–4.8)
Alkaline Phosphatase: 62 IU/L (ref 44–121)
BUN/Creatinine Ratio: 14 (ref 12–28)
BUN: 12 mg/dL (ref 8–27)
Bilirubin Total: 0.7 mg/dL (ref 0.0–1.2)
CO2: 23 mmol/L (ref 20–29)
Calcium: 9.6 mg/dL (ref 8.7–10.3)
Chloride: 100 mmol/L (ref 96–106)
Creatinine, Ser: 0.87 mg/dL (ref 0.57–1.00)
Globulin, Total: 2.3 g/dL (ref 1.5–4.5)
Glucose: 86 mg/dL (ref 70–99)
Potassium: 3.6 mmol/L (ref 3.5–5.2)
Sodium: 142 mmol/L (ref 134–144)
Total Protein: 6.8 g/dL (ref 6.0–8.5)
eGFR: 71 mL/min/{1.73_m2} (ref >59)

## 2023-04-09 LAB — CBC WITH DIFFERENTIAL/PLATELET
Basophils Absolute: 0.1 10*3/uL (ref 0.0–0.2)
Basos: 1 %
EOS (ABSOLUTE): 0.1 10*3/uL (ref 0.0–0.4)
Eos: 2 %
Hematocrit: 44.2 % (ref 34.0–46.6)
Hemoglobin: 14.3 g/dL (ref 11.1–15.9)
Immature Grans (Abs): 0 10*3/uL (ref 0.0–0.1)
Immature Granulocytes: 0 %
Lymphocytes Absolute: 2.5 10*3/uL (ref 0.7–3.1)
Lymphs: 30 %
MCH: 28.7 pg (ref 26.6–33.0)
MCHC: 32.4 g/dL (ref 31.5–35.7)
MCV: 89 fL (ref 79–97)
Monocytes Absolute: 0.5 10*3/uL (ref 0.1–0.9)
Monocytes: 6 %
Neutrophils Absolute: 5.1 10*3/uL (ref 1.4–7.0)
Neutrophils: 61 %
Platelets: 243 10*3/uL (ref 150–450)
RBC: 4.99 x10E6/uL (ref 3.77–5.28)
RDW: 12.9 % (ref 11.7–15.4)
WBC: 8.3 10*3/uL (ref 3.4–10.8)

## 2023-04-09 LAB — LIPID PANEL W/O CHOL/HDL RATIO
Cholesterol, Total: 151 mg/dL (ref 100–199)
HDL: 55 mg/dL (ref >39)
LDL Chol Calc (NIH): 73 mg/dL (ref 0–99)
Triglycerides: 133 mg/dL (ref 0–149)
VLDL Cholesterol Cal: 23 mg/dL (ref 5–40)

## 2023-04-09 LAB — MICROALBUMIN / CREATININE URINE RATIO
Creatinine, Urine: 16.6 mg/dL
Microalb/Creat Ratio: <18 mg/g{creat} (ref 0–29)
Microalbumin, Urine: <3 ug/mL

## 2023-04-09 LAB — TSH: TSH: 1 u[IU]/mL (ref 0.450–4.500)

## 2023-04-09 MED ORDER — LEVOTHYROXINE SODIUM 75 MCG PO TABS: 75.0000 ug | ORAL_TABLET | Freq: Every day | ORAL | 3 refills | Status: AC

## 2023-04-13 ENCOUNTER — Telehealth: Payer: Self-pay | Admitting: Family Medicine

## 2023-04-13 NOTE — Telephone Encounter (Signed)
 Copied from CRM [id number]. Topic: Medicare AWV >> Apr 13, 2023  2:51 PM Payton Doughty wrote: Reason for CRM: Called LVM 04/13/2023 to schedule AWV. Please schedule Virtual or Telehealth visits ONLY.   Verlee Rossetti; Care Guide Ambulatory Clinical Support [place] [facility] Direct Dial: [phone]

## 2023-06-03 DIAGNOSIS — H40003 Preglaucoma, unspecified, bilateral: Secondary | ICD-10-CM | POA: Diagnosis not present

## 2023-06-03 DIAGNOSIS — Z961 Presence of intraocular lens: Secondary | ICD-10-CM | POA: Diagnosis not present

## 2023-06-03 DIAGNOSIS — H43813 Vitreous degeneration, bilateral: Secondary | ICD-10-CM | POA: Diagnosis not present

## 2023-06-10 LAB — HM MAMMOGRAPHY

## 2023-08-26 ENCOUNTER — Other Ambulatory Visit: Payer: Self-pay | Admitting: Family Medicine

## 2023-08-27 NOTE — Telephone Encounter (Signed)
 Too soon for refill, LRF 04/08/23 for 90 and 1 RF.  Requested Prescriptions  Pending Prescriptions Disp Refills   atorvastatin  (LIPITOR) 20 MG tablet [Pharmacy Med Name: ATORVASTATIN  CALCIUM  20 MG TAB] 90 tablet 1    Sig: TAKE 1 TABLET BY MOUTH ONCE DAILY     Cardiovascular:  Antilipid - Statins Failed - 08/27/2023  9:54 AM      Failed - Lipid Panel in normal range within the last 12 months    Cholesterol, Total  Date Value Ref Range Status  04/08/2023 151 100 - 199 mg/dL Final   LDL Chol Calc (NIH)  Date Value Ref Range Status  04/08/2023 73 0 - 99 mg/dL Final   HDL  Date Value Ref Range Status  04/08/2023 55 >39 mg/dL Final   Triglycerides  Date Value Ref Range Status  04/08/2023 133 0 - 149 mg/dL Final         Passed - Patient is not pregnant      Passed - Valid encounter within last 12 months    Recent Outpatient Visits           4 months ago Encounter for [employer] annual wellness exam   [place] [facility] [place], [place], DO

## 2023-09-09 ENCOUNTER — Telehealth: Payer: Self-pay | Admitting: Family Medicine

## 2023-09-09 NOTE — Telephone Encounter (Signed)
 Patient dropped off [employer] Form completed and Letter from Provider to be filled out by provider. Patient is requesting a call back at [phone] within 2-5 days when completed. Document is located in providers folder.

## 2023-09-12 ENCOUNTER — Encounter: Payer: Self-pay | Admitting: Family Medicine

## 2023-09-12 NOTE — Telephone Encounter (Signed)
 Letter on chart. OK to print and I'll sign

## 2023-09-13 ENCOUNTER — Encounter: Payer: Self-pay | Admitting: Family Medicine

## 2023-09-15 NOTE — Telephone Encounter (Signed)
 Completed and patient received her copies.

## 2023-10-07 ENCOUNTER — Other Ambulatory Visit: Payer: Self-pay | Admitting: Family Medicine

## 2023-10-08 ENCOUNTER — Encounter: Payer: Self-pay | Admitting: Family Medicine

## 2023-10-08 ENCOUNTER — Ambulatory Visit (INDEPENDENT_AMBULATORY_CARE_PROVIDER_SITE_OTHER): Admitting: Family Medicine

## 2023-10-08 VITALS — BP 162/82 | HR 82 | Ht 62.0 in | Wt 145.2 lb

## 2023-10-08 DIAGNOSIS — I10 Essential (primary) hypertension: Secondary | ICD-10-CM

## 2023-10-08 DIAGNOSIS — E039 Hypothyroidism, unspecified: Secondary | ICD-10-CM | POA: Diagnosis not present

## 2023-10-08 DIAGNOSIS — D692 Other nonthrombocytopenic purpura: Secondary | ICD-10-CM | POA: Diagnosis not present

## 2023-10-08 DIAGNOSIS — Z23 Encounter for immunization: Secondary | ICD-10-CM | POA: Diagnosis not present

## 2023-10-08 DIAGNOSIS — E782 Mixed hyperlipidemia: Secondary | ICD-10-CM | POA: Diagnosis not present

## 2023-10-08 MED ORDER — HYDROCHLOROTHIAZIDE 25 MG PO TABS: 25.0000 mg | ORAL_TABLET | Freq: Every day | ORAL | 1 refills | Status: AC

## 2023-10-08 MED ORDER — ATORVASTATIN CALCIUM 20 MG PO TABS: 20.0000 mg | ORAL_TABLET | Freq: Every day | ORAL | 1 refills | Status: AC

## 2023-10-08 MED ORDER — POTASSIUM CHLORIDE CRYS ER 10 MEQ PO TBCR: 10.0000 meq | EXTENDED_RELEASE_TABLET | Freq: Every day | ORAL | 1 refills | Status: AC

## 2023-10-08 MED ORDER — TRAZODONE HCL 100 MG PO TABS: 100.0000 mg | ORAL_TABLET | Freq: Every evening | ORAL | 1 refills | Status: AC | PRN

## 2023-10-08 MED ORDER — DICLOFENAC SODIUM 1 % EX GEL: 4.0000 g | Freq: Four times a day (QID) | CUTANEOUS | 3 refills | Status: AC

## 2023-10-08 NOTE — Assessment & Plan Note (Signed)
 Rechecking labs today. Await results. Treat as needed.

## 2023-10-08 NOTE — Assessment & Plan Note (Signed)
 Running a little high, but doing well at home. Continue current regimen. Continue to monitor. Call with any concerns.

## 2023-10-08 NOTE — Progress Notes (Signed)
 BP (!) 162/82   Pulse 82   Ht 5' 2 (1.575 m)   Wt 145 lb 3.2 oz (65.9 kg)   SpO2 96%   BMI 26.56 kg/m    Subjective:    Patient ID: Alexandria Craig Husband, female    DOB: 04-20-1950, 73 y.o.   MRN: [id number]  HPI: Alexandria Craig is a 73 y.o. female  Chief Complaint  Patient presents with   Hypertension   Hyperlipidemia   Hypothyroidism   HYPERTENSION / HYPERLIPIDEMIA- BP running a little high today but concerned about her husband, doing well at home Satisfied with current treatment? yes Duration of hypertension: chronic BP monitoring frequency: rarely BP medication side effects: no Past BP meds: hydrochlorothiazide  Duration of hyperlipidemia: chronic Cholesterol medication side effects: no Cholesterol supplements: none Past cholesterol medications: atorvastatin  Medication compliance: excellent compliance Aspirin: no Recent stressors: no Recurrent headaches: no Visual changes: no Palpitations: no Dyspnea: no Chest pain: no Lower extremity edema: no Dizzy/lightheaded: no  HYPOTHYROIDISM Thyroid  control status:controlled Satisfied with current treatment? yes Medication side effects: no Medication compliance: excellent compliance Recent dose adjustment:no Fatigue: no Cold intolerance: no Heat intolerance: no Weight gain: no Weight loss: no Constipation: no Diarrhea/loose stools: no Palpitations: no Lower extremity edema: no Anxiety/depressed mood: no  Relevant past medical, surgical, family and social history reviewed and updated as indicated. Interim medical history since our last visit reviewed. Allergies and medications reviewed and updated.  Review of Systems  Constitutional: Negative.   Respiratory: Negative.    Cardiovascular: Negative.   Musculoskeletal: Negative.        Hand pain  Neurological: Negative.   Psychiatric/Behavioral: Negative.      Per HPI unless specifically indicated above     Objective:    BP (!) 162/82   Pulse 82    Ht 5' 2 (1.575 m)   Wt 145 lb 3.2 oz (65.9 kg)   SpO2 96%   BMI 26.56 kg/m   Wt Readings from Last 3 Encounters:  10/08/23 145 lb 3.2 oz (65.9 kg)  04/08/23 156 lb 6.4 oz (70.9 kg)  11/26/22 156 lb 9.6 oz (71 kg)    Physical Exam Vitals and nursing note reviewed.  Constitutional:      General: She is not in acute distress.    Appearance: Normal appearance. She is normal weight. She is not ill-appearing, toxic-appearing or diaphoretic.  HENT:     Head: Normocephalic and atraumatic.     Right Ear: External ear normal.     Left Ear: External ear normal.     Nose: Nose normal.     Mouth/Throat:     Mouth: Mucous membranes are moist.     Pharynx: Oropharynx is clear.  Eyes:     General: No scleral icterus.       Right eye: No discharge.        Left eye: No discharge.     Extraocular Movements: Extraocular movements intact.     Conjunctiva/sclera: Conjunctivae normal.     Pupils: Pupils are equal, round, and reactive to light.  Cardiovascular:     Rate and Rhythm: Normal rate and regular rhythm.     Pulses: Normal pulses.     Heart sounds: Normal heart sounds. No murmur heard.    No friction rub. No gallop.  Pulmonary:     Effort: Pulmonary effort is normal. No respiratory distress.     Breath sounds: Normal breath sounds. No stridor. No wheezing, rhonchi or rales.  Chest:  Chest wall: No tenderness.  Musculoskeletal:        General: Normal range of motion.     Cervical back: Normal range of motion and neck supple.  Skin:    General: Skin is warm and dry.     Capillary Refill: Capillary refill takes less than 2 seconds.     Coloration: Skin is not jaundiced or pale.     Findings: No bruising, erythema, lesion or rash.  Neurological:     General: No focal deficit present.     Mental Status: She is alert and oriented to person, place, and time. Mental status is at baseline.  Psychiatric:        Mood and Affect: Mood normal.        Behavior: Behavior normal.         Thought Content: Thought content normal.        Judgment: Judgment normal.     Results for orders placed or performed in visit on 09/13/23  HM MAMMOGRAPHY   Collection Time: 06/10/23 12:00 AM  Result Value Ref Range   HM Mammogram 0-4 Bi-Rad 0-4 Bi-Rad, Self Reported Normal      Assessment & Plan:   Problem List Items Addressed This Visit       Cardiovascular and Mediastinum   Hypertension   Running a little high, but doing well at home. Continue current regimen. Continue to monitor. Call with any concerns.       Relevant Medications   atorvastatin  (LIPITOR) 20 MG tablet   hydrochlorothiazide  (HYDRODIURIL ) 25 MG tablet   Other Relevant Orders   Comprehensive metabolic panel with GFR   Senile purpura   Reassured patient. Continue to monitor.       Relevant Medications   atorvastatin  (LIPITOR) 20 MG tablet   hydrochlorothiazide  (HYDRODIURIL ) 25 MG tablet   Other Relevant Orders   CBC with Differential/Platelet   Comprehensive metabolic panel with GFR     Endocrine   Hypothyroidism   Rechecking labs today. Await results. Treat as needed.       Relevant Orders   Comprehensive metabolic panel with GFR   TSH     Other   Hyperlipidemia - Primary   Under good control on current regimen. Continue current regimen. Continue to monitor. Call with any concerns. Refills given. Labs drawn today.        Relevant Medications   atorvastatin  (LIPITOR) 20 MG tablet   hydrochlorothiazide  (HYDRODIURIL ) 25 MG tablet   Other Relevant Orders   Comprehensive metabolic panel with GFR   Lipid Panel w/o Chol/HDL Ratio   Other Visit Diagnoses       Needs flu shot       Flu shot given today.   Relevant Orders   Flu vaccine HIGH DOSE PF(Fluzone Trivalent)        Follow up plan: Return in about 6 months (around 04/07/2024) for physical.

## 2023-10-08 NOTE — Assessment & Plan Note (Signed)
 Under good control on current regimen. Continue current regimen. Continue to monitor. Call with any concerns. Refills given. Labs drawn today.

## 2023-10-08 NOTE — Assessment & Plan Note (Signed)
 Reassured patient. Continue to monitor.

## 2023-10-08 NOTE — Telephone Encounter (Signed)
 Requested Prescriptions  Pending Prescriptions Disp Refills   traZODone  (DESYREL ) 100 MG tablet [Pharmacy Med Name: TRAZODONE  HCL 100 MG TAB] 135 tablet 0    Sig: TAKE 1-1.5 TABLETS BY MOUTH AT BEDTIME     Psychiatry: Antidepressants - Serotonin Modulator Passed - 10/08/2023  1:02 PM      Passed - Valid encounter within last 6 months    Recent Outpatient Visits           Today Mixed hyperlipidemia   [place] [facility] [place], Megan P, DO   6 months ago Encounter for [employer] annual wellness exam   [place] [facility] [place], [place], DO

## 2023-10-09 LAB — LIPID PANEL W/O CHOL/HDL RATIO
Cholesterol, Total: 152 mg/dL (ref 100–199)
HDL: 57 mg/dL (ref >39)
LDL Chol Calc (NIH): 75 mg/dL (ref 0–99)
Triglycerides: 109 mg/dL (ref 0–149)
VLDL Cholesterol Cal: 20 mg/dL (ref 5–40)

## 2023-10-09 LAB — TSH: TSH: 2.75 u[IU]/mL (ref 0.450–4.500)

## 2023-10-09 LAB — CBC WITH DIFFERENTIAL/PLATELET
Basophils Absolute: 0.1 x10E3/uL (ref 0.0–0.2)
Basos: 1 %
EOS (ABSOLUTE): 0.1 x10E3/uL (ref 0.0–0.4)
Eos: 1 %
Hematocrit: 44.8 % (ref 34.0–46.6)
Hemoglobin: 14.5 g/dL (ref 11.1–15.9)
Immature Grans (Abs): 0 x10E3/uL (ref 0.0–0.1)
Immature Granulocytes: 0 %
Lymphocytes Absolute: 2.3 x10E3/uL (ref 0.7–3.1)
Lymphs: 27 %
MCH: 29.3 pg (ref 26.6–33.0)
MCHC: 32.4 g/dL (ref 31.5–35.7)
MCV: 91 fL (ref 79–97)
Monocytes Absolute: 0.5 x10E3/uL (ref 0.1–0.9)
Monocytes: 6 %
Neutrophils Absolute: 5.4 x10E3/uL (ref 1.4–7.0)
Neutrophils: 65 %
Platelets: 247 x10E3/uL (ref 150–450)
RBC: 4.95 x10E6/uL (ref 3.77–5.28)
RDW: 13.1 % (ref 11.7–15.4)
WBC: 8.5 x10E3/uL (ref 3.4–10.8)

## 2023-10-09 LAB — COMPREHENSIVE METABOLIC PANEL WITH GFR
ALT: 13 IU/L (ref 0–32)
AST: 21 IU/L (ref 0–40)
Albumin: 4.5 g/dL (ref 3.8–4.8)
Alkaline Phosphatase: 61 IU/L (ref 49–135)
BUN/Creatinine Ratio: 12 (ref 12–28)
BUN: 12 mg/dL (ref 8–27)
Bilirubin Total: 0.6 mg/dL (ref 0.0–1.2)
CO2: 27 mmol/L (ref 20–29)
Calcium: 9.6 mg/dL (ref 8.7–10.3)
Chloride: 99 mmol/L (ref 96–106)
Creatinine, Ser: 0.99 mg/dL (ref 0.57–1.00)
Globulin, Total: 2.5 g/dL (ref 1.5–4.5)
Glucose: 97 mg/dL (ref 70–99)
Potassium: 3.6 mmol/L (ref 3.5–5.2)
Sodium: 140 mmol/L (ref 134–144)
Total Protein: 7 g/dL (ref 6.0–8.5)
eGFR: 61 mL/min/1.73 (ref >59)

## 2023-10-11 ENCOUNTER — Ambulatory Visit: Payer: Self-pay | Admitting: Family Medicine

## 2023-11-15 IMAGING — MG MM DIGITAL SCREENING BILAT W/ TOMO AND CAD
8 series · 8 of 24 positions shown · non-contrast
Comparison: Previous exam(s).

CLINICAL DATA: Screening.

EXAM:
DIGITAL SCREENING BILATERAL MAMMOGRAM WITH TOMOSYNTHESIS AND CAD
TECHNIQUE: Bilateral screening digital craniocaudal and mediolateral oblique
mammograms were obtained. Bilateral screening digital breast
tomosynthesis was performed. The images were evaluated with
computer-aided detection.

[R MLO synth-2D]
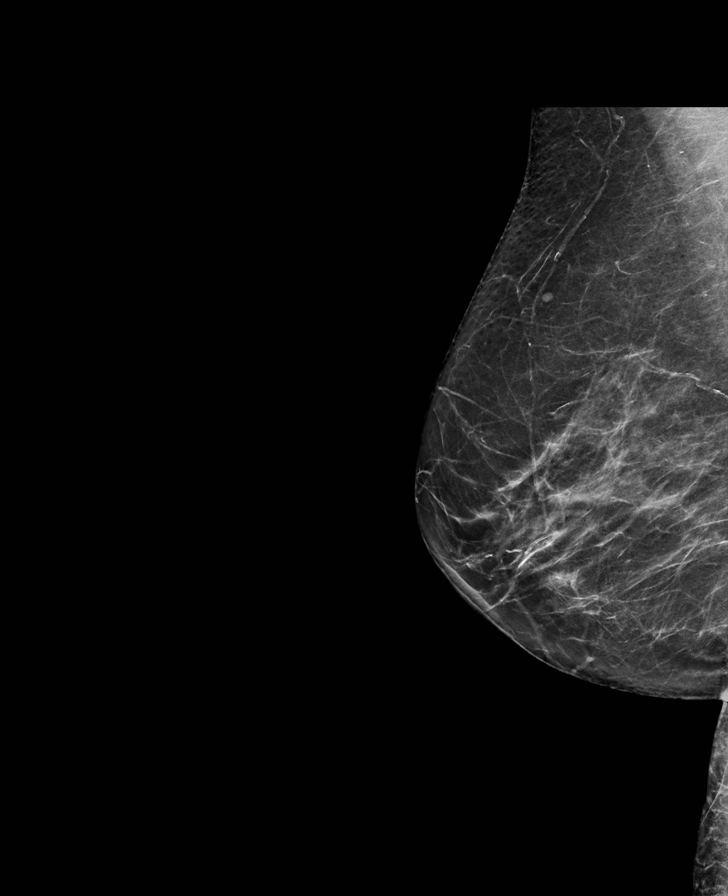

[R CC synth-2D]
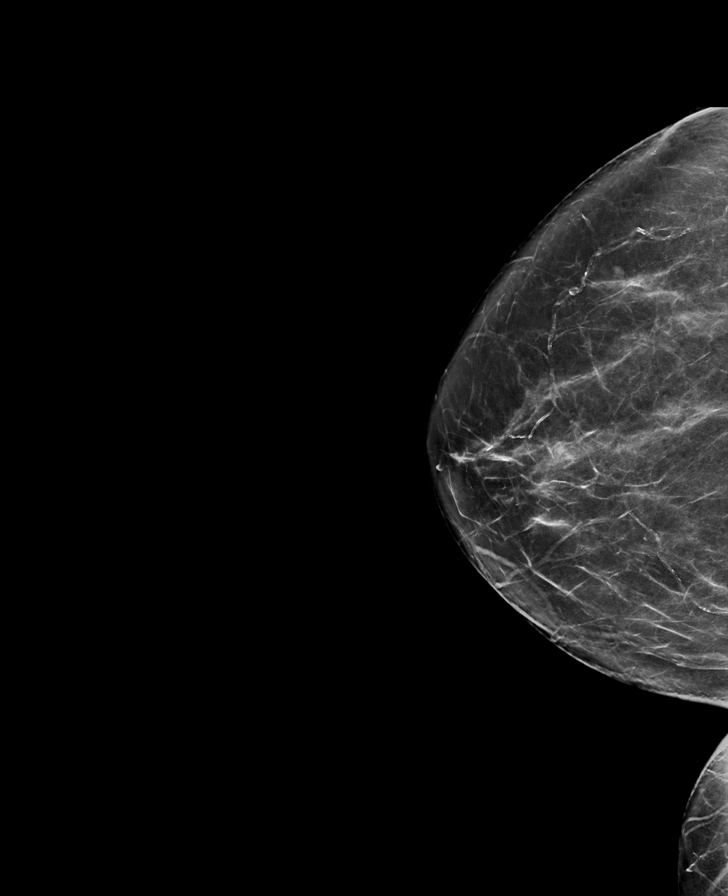

[L MLO synth-2D]
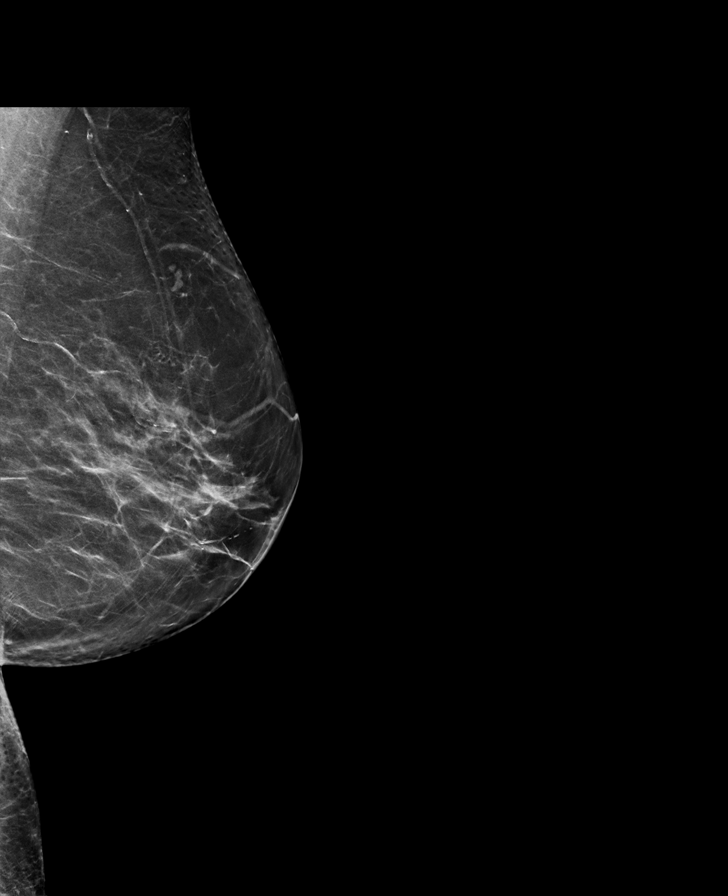

[L CC synth-2D]
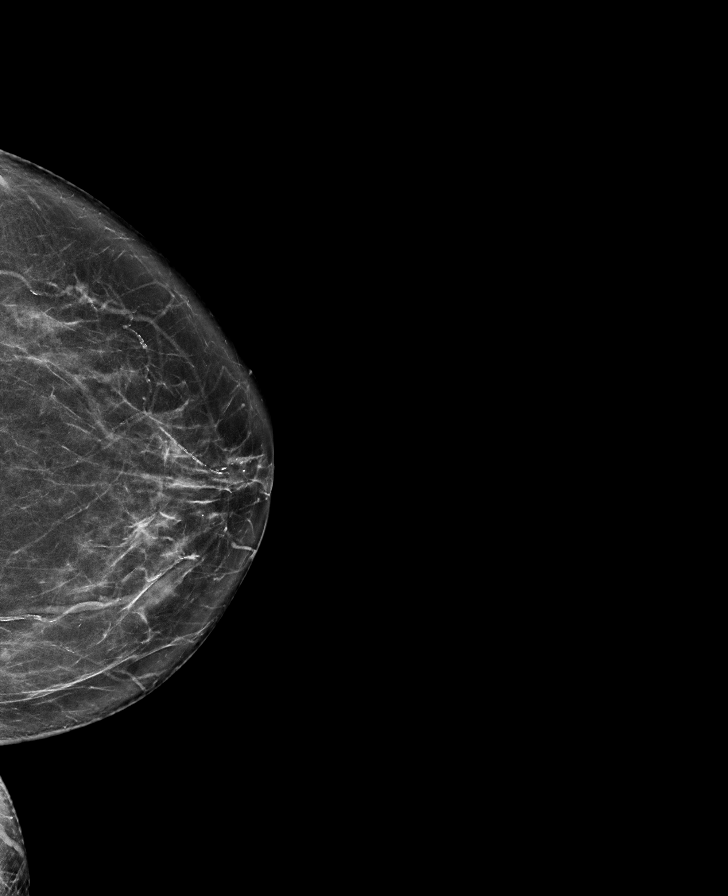

[R CC tomo · tomo slice 37/74.0]
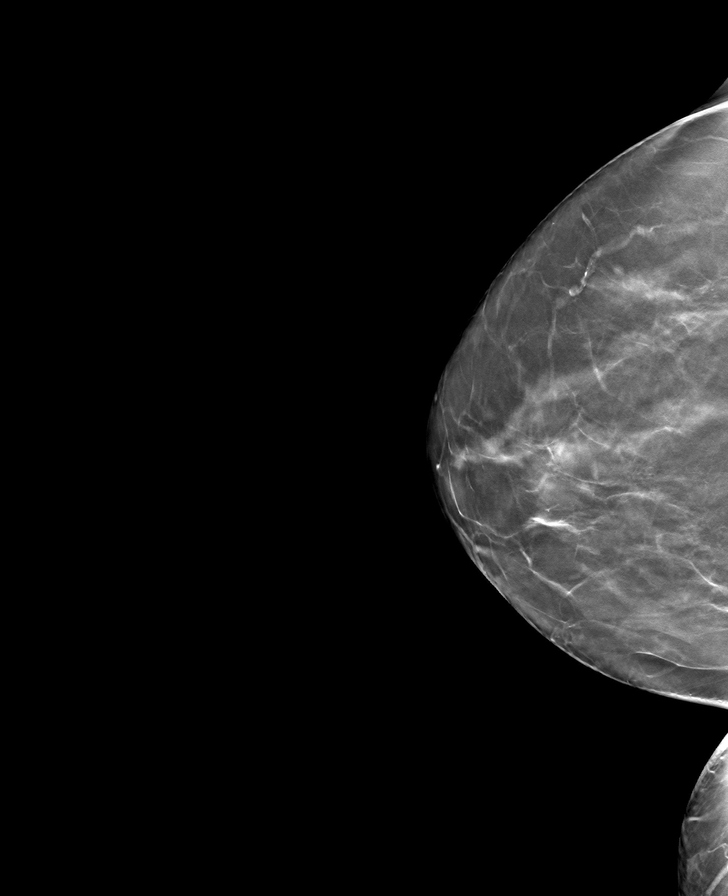

[R MLO tomo · tomo slice 38/75.0]
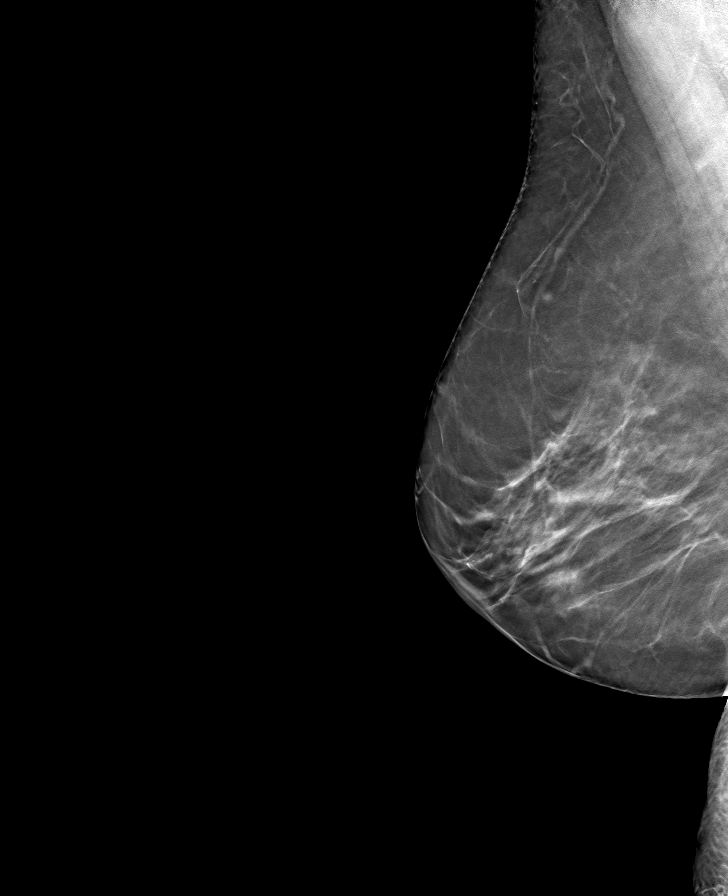

[L CC tomo · tomo slice 37/72.0]
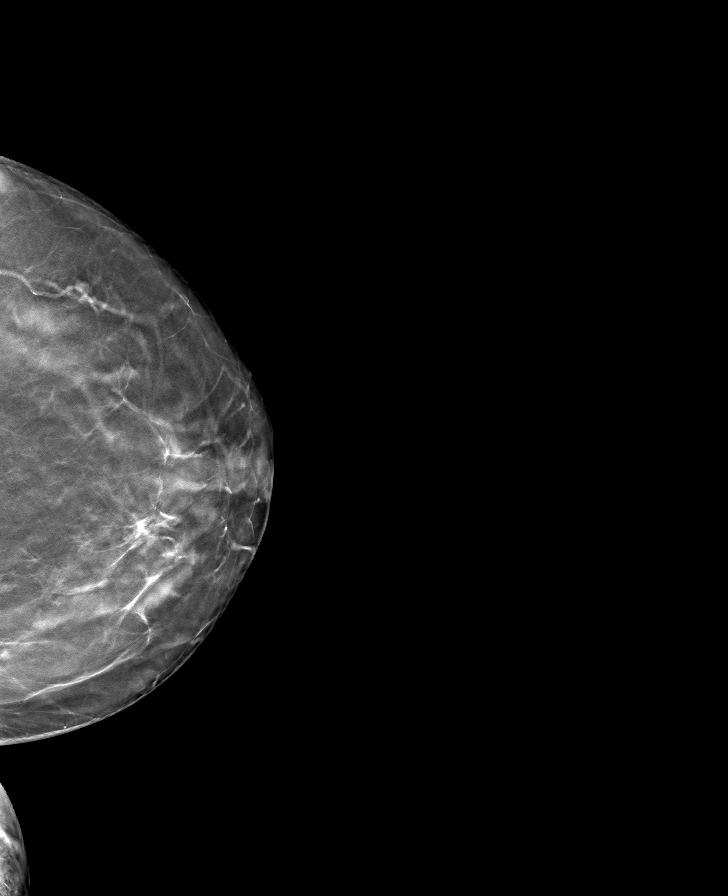

[L MLO tomo · tomo slice 37/74.0]
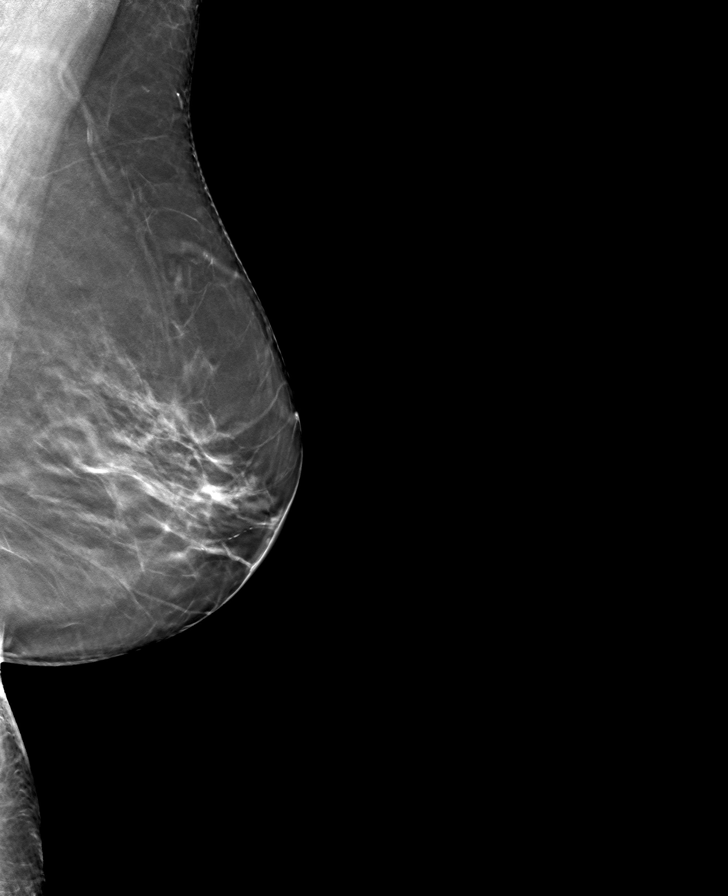

[8 of 24 positions shown; findings below may reference images not displayed]

ACR Breast Density Category b: There are scattered areas of
fibroglandular density.
FINDINGS: There are no findings suspicious for malignancy.
IMPRESSION: No mammographic evidence of malignancy. A result letter of this
screening mammogram will be mailed directly to the patient.

RECOMMENDATION:
Screening mammogram in one year. (Code:51-O-LD2)

BI-RADS CATEGORY  1: Negative.

## 2023-12-07 DIAGNOSIS — R051 Acute cough: Secondary | ICD-10-CM | POA: Diagnosis not present

## 2023-12-07 DIAGNOSIS — J209 Acute bronchitis, unspecified: Secondary | ICD-10-CM | POA: Diagnosis not present

## 2023-12-07 DIAGNOSIS — J019 Acute sinusitis, unspecified: Secondary | ICD-10-CM | POA: Diagnosis not present

## 2023-12-07 DIAGNOSIS — B9689 Other specified bacterial agents as the cause of diseases classified elsewhere: Secondary | ICD-10-CM | POA: Diagnosis not present

## 2023-12-09 DIAGNOSIS — H40003 Preglaucoma, unspecified, bilateral: Secondary | ICD-10-CM | POA: Diagnosis not present

## 2024-04-10 ENCOUNTER — Encounter: Admitting: Family Medicine
